# Patient Record
Sex: Male | Born: 1945 | Race: White | Hispanic: No | Marital: Single | Smoking: Never smoker
Health system: Southern US, Community
[De-identification: ages and names within clinical notes are randomized; demographics above are authoritative.]

## PROBLEM LIST (undated history)

## (undated) DIAGNOSIS — E785 Hyperlipidemia, unspecified: Secondary | ICD-10-CM

## (undated) DIAGNOSIS — Z8619 Personal history of other infectious and parasitic diseases: Secondary | ICD-10-CM

## (undated) DIAGNOSIS — K5790 Diverticulosis of intestine, part unspecified, without perforation or abscess without bleeding: Secondary | ICD-10-CM

## (undated) DIAGNOSIS — K429 Umbilical hernia without obstruction or gangrene: Secondary | ICD-10-CM

## (undated) DIAGNOSIS — N200 Calculus of kidney: Secondary | ICD-10-CM

## (undated) DIAGNOSIS — I10 Essential (primary) hypertension: Secondary | ICD-10-CM

## (undated) DIAGNOSIS — G8929 Other chronic pain: Secondary | ICD-10-CM

## (undated) DIAGNOSIS — M545 Low back pain, unspecified: Secondary | ICD-10-CM

## (undated) DIAGNOSIS — N481 Balanitis: Secondary | ICD-10-CM

## (undated) DIAGNOSIS — M2550 Pain in unspecified joint: Secondary | ICD-10-CM

## (undated) DIAGNOSIS — G5600 Carpal tunnel syndrome, unspecified upper limb: Secondary | ICD-10-CM

## (undated) DIAGNOSIS — R519 Headache, unspecified: Secondary | ICD-10-CM

## (undated) DIAGNOSIS — B360 Pityriasis versicolor: Secondary | ICD-10-CM

## (undated) DIAGNOSIS — K219 Gastro-esophageal reflux disease without esophagitis: Secondary | ICD-10-CM

## (undated) DIAGNOSIS — E7800 Pure hypercholesterolemia, unspecified: Secondary | ICD-10-CM

## (undated) DIAGNOSIS — M6208 Separation of muscle (nontraumatic), other site: Secondary | ICD-10-CM

## (undated) DIAGNOSIS — R51 Headache: Secondary | ICD-10-CM

## (undated) DIAGNOSIS — N400 Enlarged prostate without lower urinary tract symptoms: Secondary | ICD-10-CM

## (undated) HISTORY — DX: Low back pain: M54.5

## (undated) HISTORY — DX: Headache, unspecified: R51.9

## (undated) HISTORY — DX: Benign prostatic hyperplasia without lower urinary tract symptoms: N40.0

## (undated) HISTORY — DX: Separation of muscle (nontraumatic), other site: M62.08

## (undated) HISTORY — DX: Carpal tunnel syndrome, unspecified upper limb: G56.00

## (undated) HISTORY — DX: Umbilical hernia without obstruction or gangrene: K42.9

## (undated) HISTORY — DX: Low back pain, unspecified: M54.50

## (undated) HISTORY — DX: Headache: R51

## (undated) HISTORY — DX: Diverticulosis of intestine, part unspecified, without perforation or abscess without bleeding: K57.90

## (undated) HISTORY — DX: Hyperlipidemia, unspecified: E78.5

## (undated) HISTORY — DX: Pain in unspecified joint: M25.50

## (undated) HISTORY — DX: Other chronic pain: G89.29

## (undated) HISTORY — DX: Pityriasis versicolor: B36.0

## (undated) HISTORY — DX: Balanitis: N48.1

## (undated) HISTORY — DX: Personal history of other infectious and parasitic diseases: Z86.19

## (undated) HISTORY — PX: TONSILLECTOMY: SUR1361

---

## 2001-02-13 ENCOUNTER — Ambulatory Visit (HOSPITAL_COMMUNITY): Admission: RE | Admit: 2001-02-13 | Discharge: 2001-02-13 | Payer: Self-pay | Admitting: *Deleted

## 2001-02-13 ENCOUNTER — Encounter (INDEPENDENT_AMBULATORY_CARE_PROVIDER_SITE_OTHER): Payer: Self-pay

## 2001-03-01 ENCOUNTER — Emergency Department (HOSPITAL_COMMUNITY): Admission: EM | Admit: 2001-03-01 | Discharge: 2001-03-01 | Payer: Self-pay | Admitting: Emergency Medicine

## 2003-02-04 ENCOUNTER — Encounter: Admission: RE | Admit: 2003-02-04 | Discharge: 2003-02-04 | Payer: Self-pay | Admitting: Internal Medicine

## 2005-09-06 ENCOUNTER — Ambulatory Visit (HOSPITAL_COMMUNITY): Admission: RE | Admit: 2005-09-06 | Discharge: 2005-09-06 | Payer: Self-pay | Admitting: *Deleted

## 2005-09-06 ENCOUNTER — Encounter (INDEPENDENT_AMBULATORY_CARE_PROVIDER_SITE_OTHER): Payer: Self-pay | Admitting: Specialist

## 2005-09-08 ENCOUNTER — Ambulatory Visit (HOSPITAL_COMMUNITY): Admission: RE | Admit: 2005-09-08 | Discharge: 2005-09-08 | Payer: Self-pay | Admitting: *Deleted

## 2008-06-11 ENCOUNTER — Encounter: Admission: RE | Admit: 2008-06-11 | Discharge: 2008-07-16 | Payer: Self-pay | Admitting: Rheumatology

## 2010-02-12 ENCOUNTER — Encounter: Admission: RE | Admit: 2010-02-12 | Discharge: 2010-02-12 | Payer: Self-pay | Admitting: Gastroenterology

## 2010-08-21 NOTE — Procedures (Signed)
[facility]  Patient:    DEQUINCY, BORN Visit Number: [id number] MRN: [id number]          Service Type: END Location: ENDO Attending Physician:  Sabino Gasser Dictated by:   Sabino Gasser, M.D. Admit Date:  02/13/2001                             Procedure Report  PROCEDURE:  Upper endoscopy.  SURGEON:  Sabino Gasser, M.D.  INDICATIONS:  GERD.  ANESTHESIA:  Demerol 50 and Versed 6 mg.  DESCRIPTION OF PROCEDURE:  With the patient mildly sedated in the left lateral decubitus position, the Olympus videoscopic endoscope was inserted in the mouth and passed under direct vision through the esophagus.  The distal esophagus was approached and showed areas of esophagitis, questionable small area of Barretts that was biopsied and photographed.  We entered into the stomach.  The fundus, body, antrum, duodenal bulb, and second portion of the duodenum was visualized and photographs taken.  From this point, the endoscope was slowly withdrawn, taking circumferential views of the entire duodenal mucosa until the endoscope was pulled back in the stomach and placed on retroflexion to view the stomach from below and this was photographed.  The endoscope was straightened and withdrawn taking circumferential views of the entire gastric and esophageal mucosa, stopping in the stomach first to biopsy. There is diffuse erythema seen throughout the stomach and in the distal esophagus to biopsy the previously photographed changes of esophagitis.  The patients vital signs and pulse oximeter remained stable.  The patient tolerated the procedure well and without apparent complications.  FINDINGS:  Mild duodenitis, photographed.  Extensive gastritis with erythema diffusely, photographed and biopsied, and distal esophagitis, biopsied and photographed.  PLAN:  Await biopsy report.  The patient will call me for results and follow up with me as an outpatient.  Proceed to colonoscopy  as planned. Dictated by:   Sabino Gasser, M.D. Attending Physician:  Sabino Gasser DD:  02/13/01 TD:  02/14/01 Job: [id number]

## 2010-08-21 NOTE — Op Note (Signed)
[name], [name]              ACCOUNT NO.:  [id number]   MEDICAL RECORD NO.:  [id number]          PATIENT TYPE:  AMB   LOCATION:  ENDO                         FACILITY:  [facility]   PHYSICIAN:  Georgiana Spinner, M.D.    DATE OF BIRTH:  [birth year]   DATE OF PROCEDURE:  09/06/2005  DATE OF DISCHARGE:                                 OPERATIVE REPORT   PROCEDURE:  Upper endoscopy.   INDICATIONS:  Abdominal pain.   ANESTHESIA:  Demerol 60, Versed 5 mg.   PROCEDURE:  With the patient mildly sedated, in the left lateral decubitus  position, the Olympus videoscopic endoscope was inserted in the mouth and  passed under direct vision through the esophagus which appeared normal.  Distal esophagus was approached and the squamocolumnar junction would never  open up fully to allow me to tell if there were any areas of Barrett's  esophagus, so elected to just biopsy around the perimeter of this to look  for possible Barrett's.  We entered into the stomach.  Fundus, body, antrum,  duodenal bulb and second portion of duodenum appeared normal.  From this  point, the endoscope was slowly withdrawn, taking circumferential views of  duodenal mucosa until the endoscope had been pulled back into the stomach,  placed in retroflexion to view the stomach from below.  The endoscope was  straightened and withdrawn, taking circumferential views of remaining  gastric and esophageal mucosa.  The patient's vital signs and pulse oximeter  remained stable.  The patient tolerated procedure well without apparent  complications.   FINDINGS:  Unremarkable examination.   PLAN:  Await biopsy report and schedule the patient for CT scan of abdomen  and pelvis, and have patient follow-up with me as an outpatient.           ______________________________  Georgiana Spinner, M.D.     GMO/MEDQ  D:  09/06/2005  T:  09/06/2005  Job:  [id number]

## 2010-08-21 NOTE — Procedures (Signed)
[facility]  Patient:    Timothy Maxwell, Timothy Maxwell Visit Number: [id number] MRN: [id number]          Service Type: END Location: ENDO Attending Physician:  Sabino Gasser Dictated by:   Sabino Gasser, M.D. Admit Date:  02/13/2001                             Procedure Report  PROCEDURE:  Colonoscopy.  SURGEON:  Sabino Gasser, M.D.  INDICATIONS:  Hemoccult positivity, colon cancer screening.  ANESTHESIA:  Demerol 10 and Versed 2 mg.  DESCRIPTION OF PROCEDURE:  With the patient mildly sedated in the left lateral decubitus position, the Olympus videoscopic colonoscope was inserted in the rectum after a normal rectal exam and passed under direct vision to the cecum, identified by the ileocecal valve and appendiceal orifice, both of which were photographed.  From this point, the colonoscope was slowly withdrawn, taking circumferential views of the entire colonic mucosa, stopping to photograph rare diverticulum seen in the sigmoid colon until we reached the rectum which appeared normal on direct and showed internal hemorrhoids, small on retroflexed view.  The endoscope was straightened and withdrawn.  The patients vital signs and pulse oximeter remained stable.  The patient tolerated the procedure well without apparent complications.  FINDINGS:  An occasional diverticulum in the sigmoid and internal hemorrhoids, otherwise unremarkable colonoscopic examination to the cecum.  PLAN:  Consider repeat examination in 5-10 years. Dictated by:   Sabino Gasser, M.D. Attending Physician:  Sabino Gasser DD:  02/13/01 TD:  02/14/01 Job: [id number]

## 2011-02-24 ENCOUNTER — Other Ambulatory Visit: Payer: Self-pay | Admitting: Rheumatology

## 2011-02-24 DIAGNOSIS — M25559 Pain in unspecified hip: Secondary | ICD-10-CM

## 2011-02-24 DIAGNOSIS — M8700 Idiopathic aseptic necrosis of unspecified bone: Secondary | ICD-10-CM

## 2011-03-01 ENCOUNTER — Ambulatory Visit
Admission: RE | Admit: 2011-03-01 | Discharge: 2011-03-01 | Disposition: A | Discharge status: Home / Self Care | Payer: BC Managed Care – PPO | Source: Ambulatory Visit | Attending: Rheumatology | Admitting: Rheumatology

## 2011-03-01 DIAGNOSIS — M25559 Pain in unspecified hip: Secondary | ICD-10-CM

## 2011-03-01 DIAGNOSIS — M8700 Idiopathic aseptic necrosis of unspecified bone: Secondary | ICD-10-CM

## 2011-03-02 ENCOUNTER — Other Ambulatory Visit: Payer: Self-pay

## 2011-12-22 ENCOUNTER — Other Ambulatory Visit: Payer: Self-pay

## 2015-04-30 ENCOUNTER — Emergency Department (HOSPITAL_COMMUNITY): Payer: PPO

## 2015-04-30 ENCOUNTER — Emergency Department (HOSPITAL_COMMUNITY)
Admission: EM | Admit: 2015-04-30 | Discharge: 2015-04-30 | Disposition: A | Discharge status: Home / Self Care | Payer: PPO | Attending: Emergency Medicine | Admitting: Emergency Medicine

## 2015-04-30 ENCOUNTER — Encounter (HOSPITAL_COMMUNITY): Payer: Self-pay

## 2015-04-30 DIAGNOSIS — Z8719 Personal history of other diseases of the digestive system: Secondary | ICD-10-CM | POA: Diagnosis not present

## 2015-04-30 DIAGNOSIS — Z8639 Personal history of other endocrine, nutritional and metabolic disease: Secondary | ICD-10-CM | POA: Diagnosis not present

## 2015-04-30 DIAGNOSIS — N200 Calculus of kidney: Secondary | ICD-10-CM | POA: Diagnosis not present

## 2015-04-30 DIAGNOSIS — I10 Essential (primary) hypertension: Secondary | ICD-10-CM | POA: Insufficient documentation

## 2015-04-30 DIAGNOSIS — R109 Unspecified abdominal pain: Secondary | ICD-10-CM | POA: Diagnosis not present

## 2015-04-30 DIAGNOSIS — Z880 Allergy status to penicillin: Secondary | ICD-10-CM | POA: Diagnosis not present

## 2015-04-30 DIAGNOSIS — N132 Hydronephrosis with renal and ureteral calculous obstruction: Secondary | ICD-10-CM | POA: Insufficient documentation

## 2015-04-30 HISTORY — DX: Calculus of kidney: N20.0

## 2015-04-30 HISTORY — DX: Pure hypercholesterolemia, unspecified: E78.00

## 2015-04-30 HISTORY — DX: Essential (primary) hypertension: I10

## 2015-04-30 HISTORY — DX: Gastro-esophageal reflux disease without esophagitis: K21.9

## 2015-04-30 LAB — URINALYSIS, ROUTINE W REFLEX MICROSCOPIC
Bilirubin Urine: NEGATIVE
Glucose, UA: NEGATIVE mg/dL
Ketones, ur: 40 mg/dL — AB
Leukocytes, UA: NEGATIVE
Nitrite: NEGATIVE
Protein, ur: NEGATIVE mg/dL
Specific Gravity, Urine: 1.017 (ref 1.005–1.030)
pH: 5.5 (ref 5.0–8.0)

## 2015-04-30 LAB — CBC WITH DIFFERENTIAL/PLATELET
Basophils Absolute: 0 10*3/uL (ref 0.0–0.1)
Basophils Relative: 0 %
Eosinophils Absolute: 0 10*3/uL (ref 0.0–0.7)
Eosinophils Relative: 0 %
HCT: 46.6 % (ref 39.0–52.0)
Hemoglobin: 15.4 g/dL (ref 13.0–17.0)
Lymphocytes Relative: 8 %
Lymphs Abs: 1.1 10*3/uL (ref 0.7–4.0)
MCH: 31.8 pg (ref 26.0–34.0)
MCHC: 33 g/dL (ref 30.0–36.0)
MCV: 96.1 fL (ref 78.0–100.0)
Monocytes Absolute: 1.2 10*3/uL — ABNORMAL HIGH (ref 0.1–1.0)
Monocytes Relative: 9 %
Neutro Abs: 11.3 10*3/uL — ABNORMAL HIGH (ref 1.7–7.7)
Neutrophils Relative %: 83 %
Platelets: 253 10*3/uL (ref 150–400)
RBC: 4.85 MIL/uL (ref 4.22–5.81)
RDW: 12.9 % (ref 11.5–15.5)
WBC: 13.7 10*3/uL — ABNORMAL HIGH (ref 4.0–10.5)

## 2015-04-30 LAB — BASIC METABOLIC PANEL
Anion gap: 16 — ABNORMAL HIGH (ref 5–15)
BUN: 22 mg/dL — ABNORMAL HIGH (ref 6–20)
CO2: 20 mmol/L — ABNORMAL LOW (ref 22–32)
Calcium: 9.6 mg/dL (ref 8.9–10.3)
Chloride: 104 mmol/L (ref 101–111)
Creatinine, Ser: 1.27 mg/dL — ABNORMAL HIGH (ref 0.61–1.24)
GFR calc Af Amer: >60 mL/min (ref >60)
GFR calc non Af Amer: 57 mL/min — ABNORMAL LOW (ref >60)
Glucose, Bld: 140 mg/dL — ABNORMAL HIGH (ref 65–99)
Potassium: 3.8 mmol/L (ref 3.5–5.1)
Sodium: 140 mmol/L (ref 135–145)

## 2015-04-30 LAB — URINE MICROSCOPIC-ADD ON

## 2015-04-30 MED ORDER — OXYCODONE-ACETAMINOPHEN 5-325 MG PO TABS: 1.0000 | ORAL_TABLET | Freq: Four times a day (QID) | ORAL | Status: DC | PRN

## 2015-04-30 MED ORDER — MORPHINE SULFATE (PF) 4 MG/ML IV SOLN
4.0000 mg | Freq: Once | INTRAVENOUS | Status: AC
  Administered 2015-04-30: 4 mg via INTRAVENOUS
  Filled 2015-04-30: qty 1

## 2015-04-30 MED ORDER — TAMSULOSIN HCL 0.4 MG PO CAPS: 0.4000 mg | ORAL_CAPSULE | Freq: Every day | ORAL | Status: DC

## 2015-04-30 MED ORDER — KETOROLAC TROMETHAMINE 30 MG/ML IJ SOLN
30.0000 mg | Freq: Once | INTRAMUSCULAR | Status: AC
  Administered 2015-04-30: 30 mg via INTRAVENOUS
  Filled 2015-04-30: qty 1

## 2015-04-30 MED ORDER — ONDANSETRON HCL 4 MG/2ML IJ SOLN
4.0000 mg | Freq: Once | INTRAMUSCULAR | Status: AC
  Administered 2015-04-30: 4 mg via INTRAVENOUS
  Filled 2015-04-30: qty 2

## 2015-04-30 NOTE — ED Provider Notes (Signed)
CSN: [id number]     Arrival date & time 04/30/15  0413 History   First MD Initiated Contact with Patient 04/30/15 0425     Chief Complaint  Patient presents with  . Flank Pain     (Consider location/radiation/quality/duration/timing/severity/associated sxs/prior Treatment) HPI Comments: Patient is a 70 year old male with history of hypertension and acid reflux. He presents for evaluation of left flank pain that started 2 days ago. It has been occurring intermittently and suddenly became much more severe early this morning while he was sleeping. He denies any fevers or chills. He denies any blood in the stool or urine. He denies any difficulty urinating or having bowel movements.  Patient is a 70 y.o. male presenting with flank pain. The history is provided by the patient.  Flank Pain This is a new problem. The current episode started 2 days ago. The problem has been rapidly worsening. Pertinent negatives include no abdominal pain. Nothing aggravates the symptoms. Nothing relieves the symptoms. He has tried nothing for the symptoms. The treatment provided no relief.    Past Medical History  Diagnosis Date  . Hypertension   . GERD (gastroesophageal reflux disease)   . High cholesterol   . Renal calculi    No past surgical history on file. No family history on file. Social History  Substance Use Topics  . Smoking status: Not on file  . Smokeless tobacco: Not on file  . Alcohol Use: Not on file    Review of Systems  Gastrointestinal: Negative for abdominal pain.  Genitourinary: Positive for flank pain.  All other systems reviewed and are negative.     Allergies  Penicillins  Home Medications   Prior to Admission medications   Not on File   BP 144/82 mmHg  Pulse 86  Temp(Src) 98 F (36.7 C) (Oral)  Resp 16  SpO2 99% Physical Exam  Constitutional: He is oriented to person, place, and time. He appears well-developed and well-nourished. No distress.  HENT:  Head:  Normocephalic and atraumatic.  Mouth/Throat: Oropharynx is clear and moist.  Neck: Normal range of motion. Neck supple.  Cardiovascular: Normal rate, regular rhythm and normal heart sounds.   No murmur heard. Pulmonary/Chest: Effort normal and breath sounds normal. No respiratory distress. He has no wheezes. He has no rales.  Abdominal: Soft. Bowel sounds are normal. He exhibits no distension. There is tenderness. There is no rebound and no guarding.  There is tenderness to palpation in the left flank and left lower quadrant.  Musculoskeletal: Normal range of motion. He exhibits no edema.  Neurological: He is alert and oriented to person, place, and time.  Skin: Skin is warm and dry. He is not diaphoretic.  Nursing note and vitals reviewed.   ED Course  Procedures (including critical care time) Labs Review Labs Reviewed  URINALYSIS, ROUTINE W REFLEX MICROSCOPIC (NOT AT [facility])  BASIC METABOLIC PANEL  CBC WITH DIFFERENTIAL/PLATELET    Imaging Review No results found. I have personally reviewed and evaluated these images and lab results as part of my medical decision-making.   MDM   Final diagnoses:  None    CT reveals a 4 mm stone in the proximal left ureter with hydronephrosis. His urine is not suggestive of infection. He does have a slight white count, however no fever. He is nontoxic-appearing and feeling better after pain medication in the ER. He will be discharged with pain meds, Flomax, and follow-up with urology if not improving in the next several days.  Geoffery Lyons, MD 04/30/15 [phone]

## 2015-04-30 NOTE — ED Notes (Signed)
Bed: ZO10 Expected date:  Expected time:  Means of arrival:  Comments: EMS back and flank pain

## 2015-04-30 NOTE — ED Notes (Signed)
Awake. Verbally responsive. A/O x4. Resp even and unlabored. No audible adventitious breath sounds noted. ABC's intact.  

## 2015-04-30 NOTE — Discharge Instructions (Signed)
Percocet as prescribed as needed for pain.  Flomax as prescribed.  Follow-up with [facility] if you're not improving in the next 24-48 hours. Their contact information has been provided in this discharge summary for you to call and arrange this appointment.   Kidney Stones Kidney stones (urolithiasis) are deposits that form inside your kidneys. The intense pain is caused by the stone moving through the urinary tract. When the stone moves, the ureter goes into spasm around the stone. The stone is usually passed in the urine.  CAUSES   A disorder that makes certain neck glands produce too much parathyroid hormone (primary hyperparathyroidism).  A buildup of uric acid crystals, similar to gout in your joints.  Narrowing (stricture) of the ureter.  A kidney obstruction present at birth (congenital obstruction).  Previous surgery on the kidney or ureters.  Numerous kidney infections. SYMPTOMS   Feeling sick to your stomach (nauseous).  Throwing up (vomiting).  Blood in the urine (hematuria).  Pain that usually spreads (radiates) to the groin.  Frequency or urgency of urination. DIAGNOSIS   Taking a history and physical exam.  Blood or urine tests.  CT scan.  Occasionally, an examination of the inside of the urinary bladder (cystoscopy) is performed. TREATMENT   Observation.  Increasing your fluid intake.  Extracorporeal shock wave lithotripsy--This is a noninvasive procedure that uses shock waves to break up kidney stones.  Surgery may be needed if you have severe pain or persistent obstruction. There are various surgical procedures. Most of the procedures are performed with the use of small instruments. Only small incisions are needed to accommodate these instruments, so recovery time is minimized. The size, location, and chemical composition are all important variables that will determine the proper choice of action for you. Talk to your health care provider to  better understand your situation so that you will minimize the risk of injury to yourself and your kidney.  HOME CARE INSTRUCTIONS   Drink enough water and fluids to keep your urine clear or pale yellow. This will help you to pass the stone or stone fragments.  Strain all urine through the provided strainer. Keep all particulate matter and stones for your health care provider to see. The stone causing the pain may be as small as a grain of salt. It is very important to use the strainer each and every time you pass your urine. The collection of your stone will allow your health care provider to analyze it and verify that a stone has actually passed. The stone analysis will often identify what you can do to reduce the incidence of recurrences.  Only take over-the-counter or prescription medicines for pain, discomfort, or fever as directed by your health care provider.  Keep all follow-up visits as told by your health care provider. This is important.  Get follow-up X-rays if required. The absence of pain does not always mean that the stone has passed. It may have only stopped moving. If the urine remains completely obstructed, it can cause loss of kidney function or even complete destruction of the kidney. It is your responsibility to make sure X-rays and follow-ups are completed. Ultrasounds of the kidney can show blockages and the status of the kidney. Ultrasounds are not associated with any radiation and can be performed easily in a matter of minutes.  Make changes to your daily diet as told by your health care provider. You may be told to:  Limit the amount of salt that you eat.  Eat  5 or more servings of fruits and vegetables each day.  Limit the amount of meat, poultry, fish, and eggs that you eat.  Collect a 24-hour urine sample as told by your health care provider.You may need to collect another urine sample every 6-12 months. SEEK MEDICAL CARE IF:  You experience pain that is  progressive and unresponsive to any pain medicine you have been prescribed. SEEK IMMEDIATE MEDICAL CARE IF:   Pain cannot be controlled with the prescribed medicine.  You have a fever or shaking chills.  The severity or intensity of pain increases over 18 hours and is not relieved by pain medicine.  You develop a new onset of abdominal pain.  You feel faint or pass out.  You are unable to urinate.   This information is not intended to replace advice given to you by your health care provider. Make sure you discuss any questions you have with your health care provider.   Document Released: 03/22/2005 Document Revised: 12/11/2014 Document Reviewed: 08/23/2012 Elsevier Interactive Patient Education [employer]

## 2015-05-02 ENCOUNTER — Emergency Department (HOSPITAL_COMMUNITY)
Admission: EM | Admit: 2015-05-02 | Discharge: 2015-05-02 | Disposition: A | Discharge status: Home / Self Care | Payer: PPO | Attending: Emergency Medicine | Admitting: Emergency Medicine

## 2015-05-02 ENCOUNTER — Encounter (HOSPITAL_COMMUNITY): Payer: Self-pay | Admitting: Emergency Medicine

## 2015-05-02 ENCOUNTER — Emergency Department (HOSPITAL_COMMUNITY): Payer: PPO

## 2015-05-02 DIAGNOSIS — R7989 Other specified abnormal findings of blood chemistry: Secondary | ICD-10-CM | POA: Insufficient documentation

## 2015-05-02 DIAGNOSIS — I10 Essential (primary) hypertension: Secondary | ICD-10-CM | POA: Insufficient documentation

## 2015-05-02 DIAGNOSIS — K219 Gastro-esophageal reflux disease without esophagitis: Secondary | ICD-10-CM | POA: Diagnosis not present

## 2015-05-02 DIAGNOSIS — Z7982 Long term (current) use of aspirin: Secondary | ICD-10-CM | POA: Insufficient documentation

## 2015-05-02 DIAGNOSIS — N200 Calculus of kidney: Secondary | ICD-10-CM | POA: Insufficient documentation

## 2015-05-02 DIAGNOSIS — Z79899 Other long term (current) drug therapy: Secondary | ICD-10-CM | POA: Diagnosis not present

## 2015-05-02 DIAGNOSIS — K5900 Constipation, unspecified: Secondary | ICD-10-CM | POA: Insufficient documentation

## 2015-05-02 DIAGNOSIS — N179 Acute kidney failure, unspecified: Secondary | ICD-10-CM | POA: Insufficient documentation

## 2015-05-02 DIAGNOSIS — Z880 Allergy status to penicillin: Secondary | ICD-10-CM | POA: Diagnosis not present

## 2015-05-02 DIAGNOSIS — R079 Chest pain, unspecified: Secondary | ICD-10-CM | POA: Diagnosis not present

## 2015-05-02 DIAGNOSIS — E7800 Pure hypercholesterolemia, unspecified: Secondary | ICD-10-CM | POA: Insufficient documentation

## 2015-05-02 DIAGNOSIS — R0789 Other chest pain: Secondary | ICD-10-CM | POA: Diagnosis not present

## 2015-05-02 DIAGNOSIS — N201 Calculus of ureter: Secondary | ICD-10-CM | POA: Diagnosis not present

## 2015-05-02 LAB — CBC
HCT: 42.2 % (ref 39.0–52.0)
Hemoglobin: 14.4 g/dL (ref 13.0–17.0)
MCH: 32.1 pg (ref 26.0–34.0)
MCHC: 34.1 g/dL (ref 30.0–36.0)
MCV: 94 fL (ref 78.0–100.0)
PLATELETS: 204 10*3/uL (ref 150–400)
RBC: 4.49 MIL/uL (ref 4.22–5.81)
RDW: 12.7 % (ref 11.5–15.5)
WBC: 12.8 10*3/uL — AB (ref 4.0–10.5)

## 2015-05-02 LAB — URINALYSIS, ROUTINE W REFLEX MICROSCOPIC
BILIRUBIN URINE: NEGATIVE
GLUCOSE, UA: NEGATIVE mg/dL
Ketones, ur: NEGATIVE mg/dL
Leukocytes, UA: NEGATIVE
Nitrite: NEGATIVE
Protein, ur: NEGATIVE mg/dL
SPECIFIC GRAVITY, URINE: 1.006 (ref 1.005–1.030)
pH: 5 (ref 5.0–8.0)

## 2015-05-02 LAB — I-STAT TROPONIN, ED
TROPONIN I, POC: 0 ng/mL (ref 0.00–0.08)
Troponin i, poc: 0 ng/mL (ref 0.00–0.08)

## 2015-05-02 LAB — BASIC METABOLIC PANEL
ANION GAP: 10 (ref 5–15)
BUN: 17 mg/dL (ref 6–20)
CO2: 24 mmol/L (ref 22–32)
CREATININE: 1.53 mg/dL — AB (ref 0.61–1.24)
Calcium: 8.9 mg/dL (ref 8.9–10.3)
Chloride: 102 mmol/L (ref 101–111)
GFR, EST AFRICAN AMERICAN: 53 mL/min — AB (ref >60)
GFR, EST NON AFRICAN AMERICAN: 45 mL/min — AB (ref >60)
Glucose, Bld: 117 mg/dL — ABNORMAL HIGH (ref 65–99)
POTASSIUM: 4.4 mmol/L (ref 3.5–5.1)
SODIUM: 136 mmol/L (ref 135–145)

## 2015-05-02 LAB — URINE MICROSCOPIC-ADD ON
Squamous Epithelial / LPF: NONE SEEN
WBC UA: NONE SEEN WBC/hpf (ref 0–5)

## 2015-05-02 MED ORDER — SODIUM CHLORIDE 0.9 % IV BOLUS (SEPSIS)
1000.0000 mL | Freq: Once | INTRAVENOUS | Status: AC
  Administered 2015-05-02: 1000 mL via INTRAVENOUS

## 2015-05-02 MED ORDER — KETOROLAC TROMETHAMINE 30 MG/ML IJ SOLN
30.0000 mg | Freq: Once | INTRAMUSCULAR | Status: AC
  Administered 2015-05-02: 30 mg via INTRAVENOUS
  Filled 2015-05-02: qty 1

## 2015-05-02 MED ORDER — ONDANSETRON HCL 4 MG/2ML IJ SOLN
4.0000 mg | Freq: Once | INTRAMUSCULAR | Status: AC
  Administered 2015-05-02: 4 mg via INTRAVENOUS
  Filled 2015-05-02: qty 2

## 2015-05-02 MED ORDER — DOCUSATE SODIUM 100 MG PO CAPS: 100.0000 mg | ORAL_CAPSULE | Freq: Two times a day (BID) | ORAL | Status: DC

## 2015-05-02 NOTE — ED Notes (Signed)
Pt seen here 2 days ago for L flank pain. Was diagnosed with kidney stone. Pt reports his pain is not getting any better, so he returned for re-eval. Denies any worsening or changing symptoms. Pt also began having L side CP upon arrival to ED. EKG taken and shown to [facility] MD. No sob or lightheadedness.

## 2015-05-02 NOTE — Discharge Instructions (Signed)
Take 4 caps of miralax in 1 32oz bottle of gatorate and drink one day, followed by 2 caps a day for 2 days, followed by 1 cap a day.  Do not take NSAIDS.   Acute Kidney Injury Acute kidney injury is any condition in which there is sudden (acute) damage to the kidneys. Acute kidney injury was previously known as acute kidney failure or acute renal failure. The kidneys are two organs that lie on either side of the spine between the middle of the back and the front of the abdomen. The kidneys:  Remove wastes and extra water from the blood.   Produce important hormones. These help keep bones strong, regulate blood pressure, and help create red blood cells.   Balance the fluids and chemicals in the blood and tissues. A small amount of kidney damage may not cause problems, but a large amount of damage may make it difficult or impossible for the kidneys to work the way they should. Acute kidney injury may develop into long-lasting (chronic) kidney disease. It may also develop into a life-threatening disease called end-stage kidney disease. Acute kidney injury can get worse very quickly, so it should be treated right away. Early treatment may prevent other kidney diseases from developing. CAUSES   A problem with blood flow to the kidneys. This may be caused by:   Blood loss.   Heart disease.   Severe burns.   Liver disease.  Direct damage to the kidneys. This may be caused by:  Some medicines.   A kidney infection.   Poisoning or consuming toxic substances.   A surgical wound.   A blow to the kidney area.   A problem with urine flow. This may be caused by:   Cancer.   Kidney stones.   An enlarged prostate. SIGNS AND SYMPTOMS   Swelling (edema) of the legs, ankles, or feet.   Tiredness (lethargy).   Nausea or vomiting.   Confusion.   Problems with urination, such as:   Painful or burning feeling during urination.   Decreased urine production.    Frequent accidents in children who are potty trained.   Bloody urine.   Muscle twitches and cramps.   Shortness of breath.   Seizures.   Chest pain or pressure. Sometimes, no symptoms are present. DIAGNOSIS Acute kidney injury may be detected and diagnosed by tests, including blood, urine, imaging, or kidney biopsy tests.  TREATMENT Treatment of acute kidney injury varies depending on the cause and severity of the kidney damage. In mild cases, no treatment may be needed. The kidneys may heal on their own. If acute kidney injury is more severe, your health care provider will treat the cause of the kidney damage, help the kidneys heal, and prevent complications from occurring. Severe cases may require a procedure to remove toxic wastes from the body (dialysis) or surgery to repair kidney damage. Surgery may involve:   Repair of a torn kidney.   Removal of an obstruction. HOME CARE INSTRUCTIONS  Follow your prescribed diet.  Take medicines only as directed by your health care provider.  Do not take any new medicines (prescription, over-the-counter, or nutritional supplements) unless approved by your health care provider. Many medicines can worsen your kidney damage or may need to have the dose adjusted.   Keep all follow-up visits as directed by your health care provider. This is important.  Observe your condition to make sure you are healing as expected. SEEK IMMEDIATE MEDICAL CARE IF:  You are feeling  ill or have severe pain in the back or side.   Your symptoms return or you have new symptoms.  You have any symptoms of end-stage kidney disease. These include:   Persistent itchiness.   Loss of appetite.   Headaches.   Abnormally dark or light skin.  Numbness in the hands or feet.   Easy bruising.   Frequent hiccups.   Menstruation stops.   You have a fever.  You have increased urine production.  You have pain or bleeding when  urinating. MAKE SURE YOU:   Understand these instructions.  Will watch your condition.  Will get help right away if you are not doing well or get worse.   This information is not intended to replace advice given to you by your health care provider. Make sure you discuss any questions you have with your health care provider.   Document Released: 10/05/2010 Document Revised: 04/12/2014 Document Reviewed: 11/19/2011 Elsevier Interactive Patient Education [employer]

## 2015-05-02 NOTE — ED Provider Notes (Signed)
CSN: [id number]     Arrival date & time 05/02/15  1513 History   First MD Initiated Contact with Patient 05/02/15 1542     Chief Complaint  Patient presents with  . Chest Pain  . Nephrolithiasis     (Consider location/radiation/quality/duration/timing/severity/associated sxs/prior Treatment) HPI   Percocet, ibuprofen, similar pain relief with both Taking percocet every 5 hr, taken 2 total ibuprofen  x 2 Remote hx of kidney stones Left upper abdomen pain, and constipation, no BM, took metamucil x1 Left abd pain severe, constant, waxing/waning No dysuria/fevers  Left sided CP, nonradiates, localized to anterior chest, like a "ping" comes and goes, started on arrival to ED, nonexertional, not worse with deep breaths, no SOB, no nausea with CP (did have some before with kidney stone pain), 2/10, comes and goes  Htn/chl No DM/smoking, dad mom with CAD, dad died at  16, mom had CHF Stress test years ago was normal (greater than 64yr ago)    Past Medical History  Diagnosis Date  . Hypertension   . GERD (gastroesophageal reflux disease)   . High cholesterol   . Renal calculi    History reviewed. No pertinent past surgical history. History reviewed. No pertinent family history. Social History  Substance Use Topics  . Smoking status: None  . Smokeless tobacco: None  . Alcohol Use: None    Review of Systems  Constitutional: Negative for fever.  HENT: Negative for sore throat.   Eyes: Negative for visual disturbance.  Respiratory: Negative for shortness of breath.   Cardiovascular: Positive for chest pain.  Gastrointestinal: Positive for abdominal pain and constipation. Negative for nausea, vomiting and diarrhea.  Genitourinary: Negative for dysuria and difficulty urinating.  Musculoskeletal: Negative for back pain and neck stiffness.  Skin: Negative for rash.  Neurological: Negative for syncope and headaches.      Allergies  Penicillins  Home Medications    Prior to Admission medications   Medication Sig Start Date End Date Taking? Authorizing Provider  aspirin EC 81 MG tablet Take 81 mg by mouth daily.   Yes Historical Provider, MD  atorvastatin (LIPITOR) 40 MG tablet Take 40 mg by mouth daily.   Yes Historical Provider, MD  lisinopril (PRINIVIL,ZESTRIL) 20 MG tablet Take 20 mg by mouth daily.   Yes Historical Provider, MD  Multiple Vitamins-Minerals (MULTIVITAMIN & MINERAL PO) Take 1 tablet by mouth daily.   Yes Historical Provider, MD  oxyCODONE-acetaminophen (PERCOCET) 5-325 MG tablet Take 1-2 tablets by mouth every 6 (six) hours as needed. 04/30/15  Yes Geoffery Lyons, MD  pantoprazole (PROTONIX) 40 MG tablet Take 40 mg by mouth daily.   Yes Historical Provider, MD  tamsulosin (FLOMAX) 0.4 MG CAPS capsule Take 1 capsule (0.4 mg total) by mouth daily. 04/30/15  Yes Geoffery Lyons, MD  docusate sodium (COLACE) 100 MG capsule Take 1 capsule (100 mg total) by mouth every 12 (twelve) hours. 05/02/15   Alvira Monday, MD   BP 134/79 mmHg  Pulse 82  Temp(Src) 98.4 F (36.9 C) (Oral)  Resp 19  SpO2 100% Physical Exam  Constitutional: He is oriented to person, place, and time. He appears well-developed and well-nourished. No distress.  HENT:  Head: Normocephalic and atraumatic.  Eyes: Conjunctivae and EOM are normal.  Neck: Normal range of motion.  Cardiovascular: Normal rate, regular rhythm, normal heart sounds and intact distal pulses.  Exam reveals no gallop and no friction rub.   No murmur heard. Pulmonary/Chest: Effort normal and breath sounds normal. No respiratory distress. He  has no wheezes. He has no rales. He exhibits no tenderness.  Abdominal: Soft. He exhibits no distension. Tenderness: LUQ. There is CVA tenderness (left). There is no guarding.  Musculoskeletal: He exhibits no edema.  Neurological: He is alert and oriented to person, place, and time.  Skin: Skin is warm and dry. He is not diaphoretic.  Nursing note and vitals  reviewed.   ED Course  Procedures (including critical care time) Labs Review Labs Reviewed  BASIC METABOLIC PANEL - Abnormal; Notable for the following:    Glucose, Bld 117 (*)    Creatinine, Ser 1.53 (*)    GFR calc non Af Amer 45 (*)    GFR calc Af Amer 53 (*)    All other components within normal limits  CBC - Abnormal; Notable for the following:    WBC 12.8 (*)    All other components within normal limits  URINALYSIS, ROUTINE W REFLEX MICROSCOPIC (NOT AT [facility] - Abnormal; Notable for the following:    Hgb urine dipstick SMALL (*)    All other components within normal limits  URINE MICROSCOPIC-ADD ON - Abnormal; Notable for the following:    Bacteria, UA RARE (*)    All other components within normal limits  URINE CULTURE  I-STAT TROPOININ, ED  Rosezena Sensor, ED    Imaging Review Dg Chest 2 View  05/02/2015  CLINICAL DATA:  Left-sided chest pain for 1 day, initial encounter EXAM: CHEST  2 VIEW COMPARISON:  02/04/2003 FINDINGS: The heart size and mediastinal contours are within normal limits. Both lungs are clear. The visualized skeletal structures are unremarkable. IMPRESSION: No active cardiopulmonary disease. Electronically Signed   By: Alcide Clever M.D.   On: 05/02/2015 15:51   US Renal  05/02/2015  CLINICAL DATA:  Left ureteral stone EXAM: RENAL / URINARY TRACT ULTRASOUND COMPLETE COMPARISON:  CT 04/30/2015 FINDINGS: Right Kidney: Length: 10.7 cm. Echogenicity within normal limits. No mass or hydronephrosis visualized. Left Kidney: Length: 11.3 cm. Mild hydronephrosis. No mass. Normal echotexture. Bladder: Appears normal for degree of bladder distention. Prostate is enlarged. IMPRESSION: Mild left hydronephrosis as seen on prior CT. Prostate enlargement. Electronically Signed   By: Charlett Nose M.D.   On: 05/02/2015 18:23   I have personally reviewed and evaluated these images and lab results as part of my medical decision-making.   EKG Interpretation   Date/Time:   Friday May 02 2015 15:21:03 EST Ventricular Rate:  81 PR Interval:  142 QRS Duration: 97 QT Interval:  354 QTC Calculation: 411 R Axis:   32 Text Interpretation:  Sinus rhythm Probable left atrial enlargement No  previous ECGs available Confirmed by [facility] MD, Hedwig Mcfall ([id number]) on  05/02/2015 3:52:58 PM      MDM   Final diagnoses:  Chest pain, unspecified chest pain type  Nephrolithiasis  Acute kidney injury (HCC), creatinine 1.5  Constipation, unspecified constipation type   70 year old male with a history of hypertension, hypercholesterolemia, family history of CAD presents with concern for left upper abdominal pain. Patient was diagnosed with a 4 mm obstructing left ureteral stone 2 days ago, and has not had relief with pain medications at home. On arrival to the emergency department he began to have a feeling of pain in the left side of his chest described as an intermittent "ping."  EKG was done and by by me which showed a normal sinus rhythm without any previous EKGs for comparison. Chest x-ray showed no acute antibodies. Troponin is negative. Based on the description of symptoms  have low suspicion for PE, aortic dissection.  Have low suspicion for acute coronary syndrome in setting of 2 negative troponins..  Discussed with patient that given his age and risk factors, he is high risk, however given description of pain in association with or significant left upper abdominal and flank pain, do not feel chest pain rule out admission is indicated at this time and patient agrees.  Discussed that if patient has returning chest pain or shortness of breath he return to the emergency room emergency department, and recommended close outpatient PCP follow-up to discuss further testing.  Patient with mild acute kidney injury on labs with a creatinine up to 1.5 Toradol ordered prior to this result.  Patient given 1 L of normal saline. Discussed the patient should avoid NSAIDs, stay hydrated.  Ordered a renal ultrasound given continuing pain and acute kidney injury, which showed mild hydronephrosis. Discussed with Urology and recommend patient call office on Monday to schedule outpatient appointment. Discussed that patient may take 2 Percocet as needed for his pain. Discussed reasons to return to the emergency department in detail. In addition, pt reports constipation as contributor to pain and discussed stool softeneres, miralax, hydration. Patient discharged in stable condition with understanding of reasons to return.   Alvira Monday, MD 05/03/15 [phone]

## 2015-05-02 NOTE — ED Notes (Signed)
Pt given urinal and aware urine sample is needed. 

## 2015-05-04 LAB — URINE CULTURE

## 2015-05-27 DIAGNOSIS — N201 Calculus of ureter: Secondary | ICD-10-CM | POA: Diagnosis not present

## 2015-05-27 DIAGNOSIS — Z0000 Encounter for general adult medical examination without abnormal findings: Secondary | ICD-10-CM | POA: Diagnosis not present

## 2015-05-27 DIAGNOSIS — N401 Enlarged prostate with lower urinary tract symptoms: Secondary | ICD-10-CM | POA: Diagnosis not present

## 2015-05-27 DIAGNOSIS — N138 Other obstructive and reflux uropathy: Secondary | ICD-10-CM | POA: Diagnosis not present

## 2015-06-10 DIAGNOSIS — N201 Calculus of ureter: Secondary | ICD-10-CM | POA: Diagnosis not present

## 2015-06-10 DIAGNOSIS — Z0000 Encounter for general adult medical examination without abnormal findings: Secondary | ICD-10-CM | POA: Diagnosis not present

## 2015-07-14 DIAGNOSIS — Z125 Encounter for screening for malignant neoplasm of prostate: Secondary | ICD-10-CM | POA: Diagnosis not present

## 2015-07-14 DIAGNOSIS — I10 Essential (primary) hypertension: Secondary | ICD-10-CM | POA: Diagnosis not present

## 2015-07-14 DIAGNOSIS — K219 Gastro-esophageal reflux disease without esophagitis: Secondary | ICD-10-CM | POA: Diagnosis not present

## 2015-07-14 DIAGNOSIS — E7800 Pure hypercholesterolemia, unspecified: Secondary | ICD-10-CM | POA: Diagnosis not present

## 2015-07-14 DIAGNOSIS — Z0000 Encounter for general adult medical examination without abnormal findings: Secondary | ICD-10-CM | POA: Diagnosis not present

## 2015-07-17 DIAGNOSIS — E782 Mixed hyperlipidemia: Secondary | ICD-10-CM | POA: Diagnosis not present

## 2015-07-17 DIAGNOSIS — K429 Umbilical hernia without obstruction or gangrene: Secondary | ICD-10-CM | POA: Diagnosis not present

## 2015-07-17 DIAGNOSIS — R072 Precordial pain: Secondary | ICD-10-CM | POA: Diagnosis not present

## 2015-07-17 DIAGNOSIS — K219 Gastro-esophageal reflux disease without esophagitis: Secondary | ICD-10-CM | POA: Diagnosis not present

## 2015-08-05 ENCOUNTER — Encounter: Payer: Self-pay | Admitting: *Deleted

## 2015-08-08 ENCOUNTER — Encounter: Payer: Self-pay | Admitting: Cardiovascular Disease

## 2015-08-08 ENCOUNTER — Ambulatory Visit (INDEPENDENT_AMBULATORY_CARE_PROVIDER_SITE_OTHER): Payer: PPO | Admitting: Cardiovascular Disease

## 2015-08-08 VITALS — BP 120/72 | HR 77 | Ht 65.0 in | Wt 144.8 lb

## 2015-08-08 DIAGNOSIS — Z7189 Other specified counseling: Secondary | ICD-10-CM | POA: Diagnosis not present

## 2015-08-08 DIAGNOSIS — Z7689 Persons encountering health services in other specified circumstances: Secondary | ICD-10-CM

## 2015-08-08 DIAGNOSIS — R0789 Other chest pain: Secondary | ICD-10-CM | POA: Diagnosis not present

## 2015-08-08 NOTE — Patient Instructions (Signed)
Medication Instructions:  Your physician recommends that you continue on your current medications as directed. Please refer to the Current Medication list given to you today.  Labwork: NONE  Testing/Procedures: Your physician has requested that you have an exercise tolerance test. For further information please visit [email or web address]. Please also follow instruction sheet, as given.  Follow-Up: Your physician wants you to follow-up as needed.   If you need a refill on your cardiac medications before your next appointment, please call your pharmacy.

## 2015-08-08 NOTE — Progress Notes (Addendum)
Patient ID: Timothy Maxwell, male   DOB: [birth year], 70 y.o.   MRN: [id number]     Cardiology Office Note   Date:  08/08/2015   ID:  Timothy Maxwell, DOB 1945/10/01, MRN [id number]  PCP:  Londell Moh, MD  Cardiologist:   Charlton Haws, MD   No chief complaint on file.     History of Present Illness: Timothy Maxwell is a 70 y.o. male who presents for evaluation of chest pain CRF;s elevated lipids, family history and HTN.  Dad died of MI at age 97  Seen in ER January with abdominal pain and kidney stone. Also complained of SSCP sharp left side of chest like a "ping".  R/O normal ECG Has had normal stress tests in past more than 5 years ago. Saw Dr Phoebe Perch for urology Passed stone On flomax for prostate.  Retired from [employer]. Walks daily without issues. Occasional palpitations at night. No recurrent chest pain     Past Medical History  Diagnosis Date  . Hypertension   . GERD (gastroesophageal reflux disease)   . High cholesterol   . Renal calculi   . HLD (hyperlipidemia)   . Chronic headaches   . CTS (carpal tunnel syndrome)   . History of Helicobacter infection   . BPH (benign prostatic hyperplasia)   . Lower back pain   . Tinea versicolor   . Umbilical hernia   . Diastasis recti   . Arthralgia of multiple joints   . Balanitis   . Diverticulosis     Past Surgical History  Procedure Laterality Date  . Tonsillectomy       Current Outpatient Prescriptions  Medication Sig Dispense Refill  . aspirin EC 81 MG tablet Take 81 mg by mouth daily.    Marland Kitchen atorvastatin (LIPITOR) 40 MG tablet Take 40 mg by mouth daily.    Marland Kitchen docusate sodium (COLACE) 100 MG capsule Take 1 capsule (100 mg total) by mouth every 12 (twelve) hours. 60 capsule 0  . lisinopril (PRINIVIL,ZESTRIL) 20 MG tablet Take 20 mg by mouth daily.    . Multiple Vitamins-Minerals (MULTIVITAMIN & MINERAL PO) Take 1 tablet by mouth daily.    Marland Kitchen oxyCODONE-acetaminophen (PERCOCET) 5-325 MG tablet Take 1-2 tablets by  mouth every 6 (six) hours as needed. 25 tablet 0  . pantoprazole (PROTONIX) 40 MG tablet Take 40 mg by mouth daily.    . tamsulosin (FLOMAX) 0.4 MG CAPS capsule Take 1 capsule (0.4 mg total) by mouth daily. 10 capsule 0   No current facility-administered medications for this visit.    Allergies:   Penicillins    Social History:  The patient  reports that he has never smoked. He has never used smokeless tobacco. He reports that he does not drink alcohol or use illicit drugs.   Family History:  The patient's family history includes Emphysema in his mother; Heart disease in his father.    ROS:  Please see the history of present illness.   Otherwise, review of systems are positive for none.   All other systems are reviewed and negative.    PHYSICAL EXAM: VS:  There were no vitals taken for this visit. , BMI There is no height or weight on file to calculate BMI. Affect appropriate Healthy:  appears stated age HEENT: normal Neck supple with no adenopathy JVP normal no bruits no thyromegaly Lungs clear with no wheezing and good diaphragmatic motion Heart:  S1/S2 no murmur, no rub, gallop or click PMI normal Abdomen: benighn, BS  positve, no tenderness, no AAA no bruit.  No HSM or HJR Distal pulses intact with no bruits No edema Neuro non-focal Skin warm and dry No muscular weakness    EKG:  05/03/15  SR rate 91  Normal  08/07/15  SR rate 75 normal    Recent Labs: 05/02/2015: BUN 17; Creatinine, Ser 1.53*; Hemoglobin 14.4; Platelets 204; Potassium 4.4; Sodium 136    Lipid Panel No results found for: CHOL, TRIG, HDL, CHOLHDL, VLDL, LDLCALC, LDLDIRECT    Wt Readings from Last 3 Encounters:  No data found for Wt      Other studies Reviewed: Additional studies/ records that were reviewed today include: ER records 05/03/15 ECG labs and primary care/ urology notes see HPI.    ASSESSMENT AND PLAN:  1.  Chest Pain : Atypical normal ECG f/u ETT 2. HTN:  Well controlled.   Continue current medications and low sodium Dash type diet.   3. Chol:   On statin labs with primary 4. Urology:  Passed stone f/u Otelin no residual infection or hematuria    Current medicines are reviewed at length with the patient today.  The patient does not have concerns regarding medicines.  The following changes have been made:  no change  Labs/ tests ordered today include: ETT  No orders of the defined types were placed in this encounter.     Disposition:   FU with me PRN     Signed, Charlton HawsPeter Nishan, MD  08/08/2015 8:57 AM    [facility] [address], [place], [place]  [address] Phone: [phone]; Fax: [phone]

## 2015-08-12 ENCOUNTER — Ambulatory Visit (INDEPENDENT_AMBULATORY_CARE_PROVIDER_SITE_OTHER): Payer: PPO

## 2015-08-12 DIAGNOSIS — R0789 Other chest pain: Secondary | ICD-10-CM | POA: Diagnosis not present

## 2015-08-12 DIAGNOSIS — Z7189 Other specified counseling: Secondary | ICD-10-CM

## 2015-08-12 DIAGNOSIS — Z7689 Persons encountering health services in other specified circumstances: Secondary | ICD-10-CM

## 2015-08-12 LAB — EXERCISE TOLERANCE TEST
CHL CUP MPHR: 153 {beats}/min
CHL CUP STRESS STAGE 1 DBP: 83 mmHg
CHL CUP STRESS STAGE 1 GRADE: 0 %
CHL CUP STRESS STAGE 1 SPEED: 0 mph
CHL CUP STRESS STAGE 2 SPEED: 1 mph
CHL CUP STRESS STAGE 3 GRADE: 0 %
CHL CUP STRESS STAGE 3 HR: 83 {beats}/min
CHL CUP STRESS STAGE 3 SPEED: 1 mph
CHL CUP STRESS STAGE 4 DBP: 80 mmHg
CHL CUP STRESS STAGE 4 SBP: 160 mmHg
CHL CUP STRESS STAGE 5 HR: 125 {beats}/min
CHL CUP STRESS STAGE 6 DBP: 81 mmHg
CHL CUP STRESS STAGE 6 SPEED: 3.4 mph
CHL CUP STRESS STAGE 7 GRADE: 14 %
CHL CUP STRESS STAGE 8 DBP: 82 mmHg
CHL CUP STRESS STAGE 8 GRADE: 0 %
CHL CUP STRESS STAGE 8 HR: 141 {beats}/min
CHL CUP STRESS STAGE 8 SBP: 205 mmHg
CHL CUP STRESS STAGE 9 DBP: 72 mmHg
CHL CUP STRESS STAGE 9 GRADE: 0 %
CHL RATE OF PERCEIVED EXERTION: 16
CSEPED: 9 min
CSEPEDS: 0 s
CSEPEW: 10.1 METS
CSEPPMHR: 101 %
Peak HR: 155 {beats}/min
Percent HR: 102 %
Rest HR: 74 {beats}/min
Stage 1 HR: 81 {beats}/min
Stage 1 SBP: 143 mmHg
Stage 2 Grade: 0 %
Stage 2 HR: 84 {beats}/min
Stage 4 Grade: 10 %
Stage 4 HR: 106 {beats}/min
Stage 4 Speed: 1.7 mph
Stage 5 DBP: 80 mmHg
Stage 5 Grade: 12 %
Stage 5 SBP: 183 mmHg
Stage 5 Speed: 2.5 mph
Stage 6 Grade: 14 %
Stage 6 HR: 155 {beats}/min
Stage 6 SBP: 213 mmHg
Stage 7 HR: 155 {beats}/min
Stage 7 Speed: 3.4 mph
Stage 8 Speed: 1.5 mph
Stage 9 HR: 102 {beats}/min
Stage 9 SBP: 156 mmHg
Stage 9 Speed: 0 mph

## 2015-11-27 DIAGNOSIS — H2513 Age-related nuclear cataract, bilateral: Secondary | ICD-10-CM | POA: Diagnosis not present

## 2015-11-27 DIAGNOSIS — H04123 Dry eye syndrome of bilateral lacrimal glands: Secondary | ICD-10-CM | POA: Diagnosis not present

## 2015-11-27 DIAGNOSIS — H01021 Squamous blepharitis right upper eyelid: Secondary | ICD-10-CM | POA: Diagnosis not present

## 2015-11-27 DIAGNOSIS — H01024 Squamous blepharitis left upper eyelid: Secondary | ICD-10-CM | POA: Diagnosis not present

## 2016-02-18 DIAGNOSIS — Z23 Encounter for immunization: Secondary | ICD-10-CM | POA: Diagnosis not present

## 2016-07-15 DIAGNOSIS — Z0000 Encounter for general adult medical examination without abnormal findings: Secondary | ICD-10-CM | POA: Diagnosis not present

## 2016-07-15 DIAGNOSIS — I10 Essential (primary) hypertension: Secondary | ICD-10-CM | POA: Diagnosis not present

## 2016-07-15 DIAGNOSIS — Z23 Encounter for immunization: Secondary | ICD-10-CM | POA: Diagnosis not present

## 2016-07-15 DIAGNOSIS — Z125 Encounter for screening for malignant neoplasm of prostate: Secondary | ICD-10-CM | POA: Diagnosis not present

## 2016-07-20 DIAGNOSIS — Z7982 Long term (current) use of aspirin: Secondary | ICD-10-CM | POA: Diagnosis not present

## 2016-07-20 DIAGNOSIS — I10 Essential (primary) hypertension: Secondary | ICD-10-CM | POA: Diagnosis not present

## 2016-07-20 DIAGNOSIS — Z1212 Encounter for screening for malignant neoplasm of rectum: Secondary | ICD-10-CM | POA: Diagnosis not present

## 2016-07-20 DIAGNOSIS — Z0001 Encounter for general adult medical examination with abnormal findings: Secondary | ICD-10-CM | POA: Diagnosis not present

## 2016-07-20 DIAGNOSIS — Z880 Allergy status to penicillin: Secondary | ICD-10-CM | POA: Diagnosis not present

## 2016-08-24 DIAGNOSIS — I10 Essential (primary) hypertension: Secondary | ICD-10-CM | POA: Diagnosis not present

## 2016-08-24 DIAGNOSIS — Z8719 Personal history of other diseases of the digestive system: Secondary | ICD-10-CM | POA: Diagnosis not present

## 2016-08-24 DIAGNOSIS — N400 Enlarged prostate without lower urinary tract symptoms: Secondary | ICD-10-CM | POA: Diagnosis not present

## 2016-09-28 DIAGNOSIS — R319 Hematuria, unspecified: Secondary | ICD-10-CM | POA: Diagnosis not present

## 2016-09-28 DIAGNOSIS — R361 Hematospermia: Secondary | ICD-10-CM | POA: Diagnosis not present

## 2016-09-28 DIAGNOSIS — N390 Urinary tract infection, site not specified: Secondary | ICD-10-CM | POA: Diagnosis not present

## 2016-09-30 DIAGNOSIS — R310 Gross hematuria: Secondary | ICD-10-CM | POA: Diagnosis not present

## 2016-09-30 DIAGNOSIS — N400 Enlarged prostate without lower urinary tract symptoms: Secondary | ICD-10-CM | POA: Diagnosis not present

## 2016-09-30 DIAGNOSIS — R361 Hematospermia: Secondary | ICD-10-CM | POA: Diagnosis not present

## 2016-10-11 DIAGNOSIS — N200 Calculus of kidney: Secondary | ICD-10-CM | POA: Diagnosis not present

## 2016-10-11 DIAGNOSIS — R310 Gross hematuria: Secondary | ICD-10-CM | POA: Diagnosis not present

## 2016-10-19 DIAGNOSIS — R310 Gross hematuria: Secondary | ICD-10-CM | POA: Diagnosis not present

## 2016-10-19 DIAGNOSIS — N200 Calculus of kidney: Secondary | ICD-10-CM | POA: Diagnosis not present

## 2016-12-16 DIAGNOSIS — H2513 Age-related nuclear cataract, bilateral: Secondary | ICD-10-CM | POA: Diagnosis not present

## 2016-12-16 DIAGNOSIS — H04123 Dry eye syndrome of bilateral lacrimal glands: Secondary | ICD-10-CM | POA: Diagnosis not present

## 2016-12-16 DIAGNOSIS — H43813 Vitreous degeneration, bilateral: Secondary | ICD-10-CM | POA: Diagnosis not present

## 2017-01-19 DIAGNOSIS — H01021 Squamous blepharitis right upper eyelid: Secondary | ICD-10-CM | POA: Diagnosis not present

## 2017-01-19 DIAGNOSIS — H01024 Squamous blepharitis left upper eyelid: Secondary | ICD-10-CM | POA: Diagnosis not present

## 2017-01-19 DIAGNOSIS — H16223 Keratoconjunctivitis sicca, not specified as Sjogren's, bilateral: Secondary | ICD-10-CM | POA: Diagnosis not present

## 2017-01-19 DIAGNOSIS — H04123 Dry eye syndrome of bilateral lacrimal glands: Secondary | ICD-10-CM | POA: Diagnosis not present

## 2017-02-03 DIAGNOSIS — M542 Cervicalgia: Secondary | ICD-10-CM | POA: Diagnosis not present

## 2017-02-03 DIAGNOSIS — Z23 Encounter for immunization: Secondary | ICD-10-CM | POA: Diagnosis not present

## 2017-02-11 DIAGNOSIS — M542 Cervicalgia: Secondary | ICD-10-CM | POA: Diagnosis not present

## 2017-02-17 ENCOUNTER — Other Ambulatory Visit: Payer: Self-pay | Admitting: Otolaryngology

## 2017-02-17 DIAGNOSIS — M542 Cervicalgia: Secondary | ICD-10-CM

## 2017-02-23 ENCOUNTER — Ambulatory Visit
Admission: RE | Admit: 2017-02-23 | Discharge: 2017-02-23 | Disposition: A | Discharge status: Home / Self Care | Payer: PPO | Source: Ambulatory Visit | Attending: Otolaryngology | Admitting: Otolaryngology

## 2017-02-23 DIAGNOSIS — M542 Cervicalgia: Secondary | ICD-10-CM

## 2017-02-23 MED ORDER — IOPAMIDOL (ISOVUE-300) INJECTION 61%
75.0000 mL | Freq: Once | INTRAVENOUS | Status: AC | PRN
  Administered 2017-02-23: 75 mL via INTRAVENOUS

## 2017-03-24 DIAGNOSIS — H01021 Squamous blepharitis right upper eyelid: Secondary | ICD-10-CM | POA: Diagnosis not present

## 2017-03-24 DIAGNOSIS — H01024 Squamous blepharitis left upper eyelid: Secondary | ICD-10-CM | POA: Diagnosis not present

## 2017-03-24 DIAGNOSIS — H16223 Keratoconjunctivitis sicca, not specified as Sjogren's, bilateral: Secondary | ICD-10-CM | POA: Diagnosis not present

## 2017-07-26 DIAGNOSIS — Z125 Encounter for screening for malignant neoplasm of prostate: Secondary | ICD-10-CM | POA: Diagnosis not present

## 2017-07-26 DIAGNOSIS — Z7982 Long term (current) use of aspirin: Secondary | ICD-10-CM | POA: Diagnosis not present

## 2017-07-26 DIAGNOSIS — K219 Gastro-esophageal reflux disease without esophagitis: Secondary | ICD-10-CM | POA: Diagnosis not present

## 2017-07-26 DIAGNOSIS — E7800 Pure hypercholesterolemia, unspecified: Secondary | ICD-10-CM | POA: Diagnosis not present

## 2017-07-29 DIAGNOSIS — K5790 Diverticulosis of intestine, part unspecified, without perforation or abscess without bleeding: Secondary | ICD-10-CM | POA: Diagnosis not present

## 2017-07-29 DIAGNOSIS — N400 Enlarged prostate without lower urinary tract symptoms: Secondary | ICD-10-CM | POA: Diagnosis not present

## 2017-07-29 DIAGNOSIS — M6208 Separation of muscle (nontraumatic), other site: Secondary | ICD-10-CM | POA: Diagnosis not present

## 2017-07-29 DIAGNOSIS — I10 Essential (primary) hypertension: Secondary | ICD-10-CM | POA: Diagnosis not present

## 2017-07-29 DIAGNOSIS — M19041 Primary osteoarthritis, right hand: Secondary | ICD-10-CM | POA: Diagnosis not present

## 2017-07-29 DIAGNOSIS — M19042 Primary osteoarthritis, left hand: Secondary | ICD-10-CM | POA: Diagnosis not present

## 2017-07-29 DIAGNOSIS — E782 Mixed hyperlipidemia: Secondary | ICD-10-CM | POA: Diagnosis not present

## 2017-07-29 DIAGNOSIS — Z0001 Encounter for general adult medical examination with abnormal findings: Secondary | ICD-10-CM | POA: Diagnosis not present

## 2017-07-29 DIAGNOSIS — B360 Pityriasis versicolor: Secondary | ICD-10-CM | POA: Diagnosis not present

## 2017-07-29 DIAGNOSIS — Z7982 Long term (current) use of aspirin: Secondary | ICD-10-CM | POA: Diagnosis not present

## 2017-07-29 DIAGNOSIS — K219 Gastro-esophageal reflux disease without esophagitis: Secondary | ICD-10-CM | POA: Diagnosis not present

## 2017-07-29 DIAGNOSIS — G5600 Carpal tunnel syndrome, unspecified upper limb: Secondary | ICD-10-CM | POA: Diagnosis not present

## 2018-01-31 DIAGNOSIS — Z23 Encounter for immunization: Secondary | ICD-10-CM | POA: Diagnosis not present

## 2018-07-20 IMAGING — CT CT NECK W/ CM
2 of 4 series · 6 of 14 positions shown, 7 images · IV contrast (iopamidol)
Comparison: None.

CLINICAL DATA: Left neck pain

EXAM:
CT NECK WITH CONTRAST
TECHNIQUE: Multidetector CT imaging of the neck was performed using the
standard protocol following the bolus administration of intravenous
contrast.
CONTRAST:  75mL J95T9Y-LNN IOPAMIDOL (J95T9Y-LNN) INJECTION 61%

[Series 3: neck · axial · 0.50mm/px · z∈[-268,-140]mm · 3 of 128 slices shown]
[im 32/128  bone]
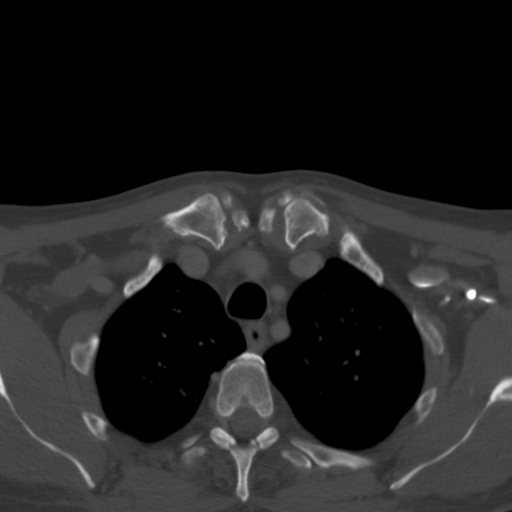
[im 64/128  bone]
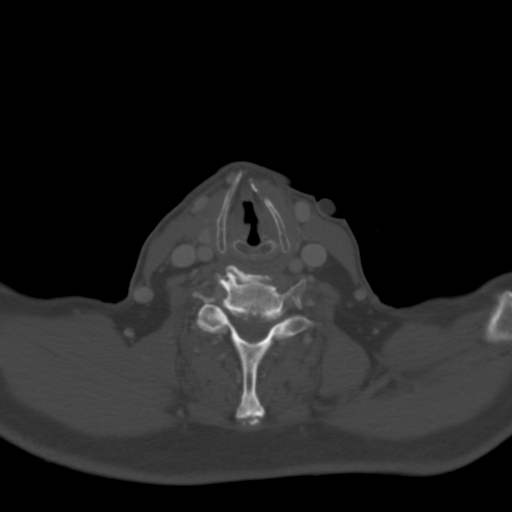
[im 96/128  bone]
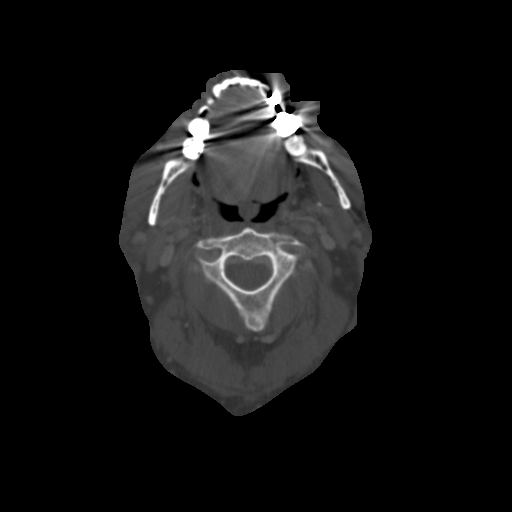

[Series 7: angled axial-oropharynx · axial · 0.39mm/px · z∈[-307,-171]mm · 3 of 145 slices shown, 4 images]
[im 37/145  soft-tissue]
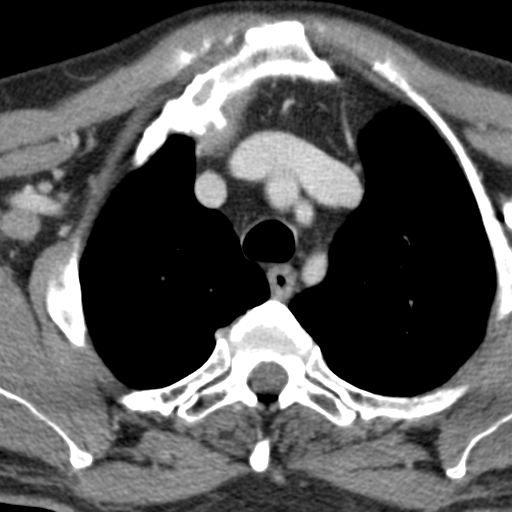
[im 37/145  bone]
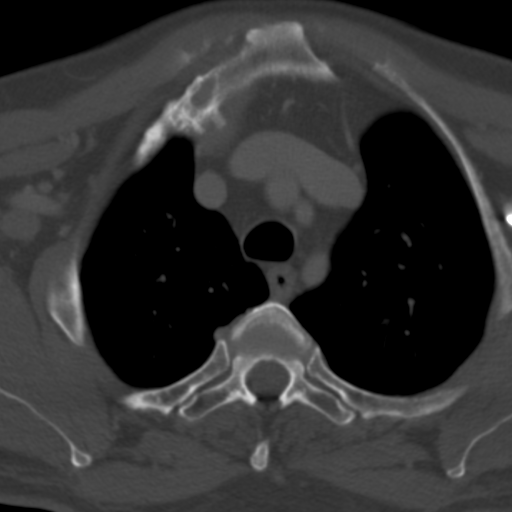
[im 73/145  bone]
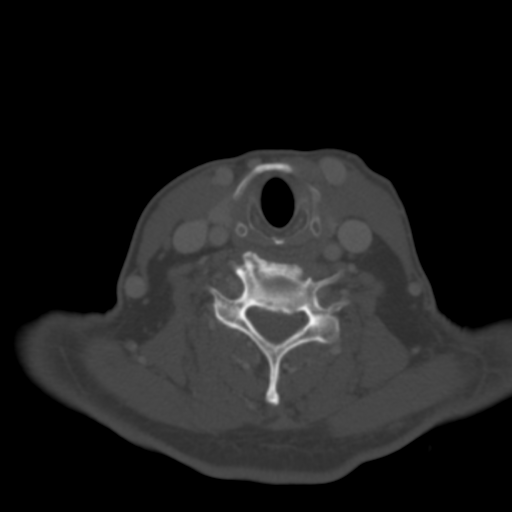
[im 109/145  bone]
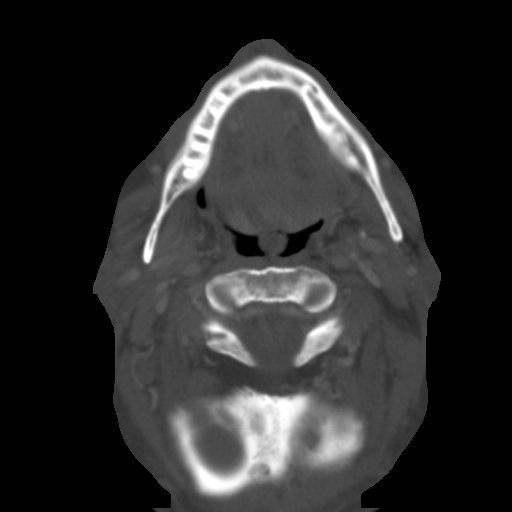

[6 of 14 positions shown; findings below may reference images not displayed]

FINDINGS: Pharynx and larynx: Normal. No mass or swelling.

Salivary glands: No inflammation, mass, or stone.

Thyroid: Negative

Lymph nodes: No pathologic or enlarged lymph nodes.

Vascular: Negative

Limited intracranial: Negative

Visualized orbits: Negative

Mastoids and visualized paranasal sinuses: Negative

Skeleton: Mild periapical lucency around left upper molar compatible
with dental infection. Cervical spondylosis.

Upper chest: Negative

Other: Negative
IMPRESSION: Negative for mass or adenopathy

Periapical lucency around left upper molar compatible with dental
infection.

## 2018-08-02 DIAGNOSIS — Z7982 Long term (current) use of aspirin: Secondary | ICD-10-CM | POA: Diagnosis not present

## 2018-08-02 DIAGNOSIS — Z125 Encounter for screening for malignant neoplasm of prostate: Secondary | ICD-10-CM | POA: Diagnosis not present

## 2018-08-02 DIAGNOSIS — Z1159 Encounter for screening for other viral diseases: Secondary | ICD-10-CM | POA: Diagnosis not present

## 2018-08-02 DIAGNOSIS — I10 Essential (primary) hypertension: Secondary | ICD-10-CM | POA: Diagnosis not present

## 2018-08-02 DIAGNOSIS — K219 Gastro-esophageal reflux disease without esophagitis: Secondary | ICD-10-CM | POA: Diagnosis not present

## 2018-08-07 DIAGNOSIS — Z0001 Encounter for general adult medical examination with abnormal findings: Secondary | ICD-10-CM | POA: Diagnosis not present

## 2018-08-07 DIAGNOSIS — H04123 Dry eye syndrome of bilateral lacrimal glands: Secondary | ICD-10-CM | POA: Diagnosis not present

## 2018-08-07 DIAGNOSIS — K429 Umbilical hernia without obstruction or gangrene: Secondary | ICD-10-CM | POA: Diagnosis not present

## 2018-08-07 DIAGNOSIS — I10 Essential (primary) hypertension: Secondary | ICD-10-CM | POA: Diagnosis not present

## 2018-08-07 DIAGNOSIS — M6208 Separation of muscle (nontraumatic), other site: Secondary | ICD-10-CM | POA: Diagnosis not present

## 2018-08-07 DIAGNOSIS — Z7982 Long term (current) use of aspirin: Secondary | ICD-10-CM | POA: Diagnosis not present

## 2018-08-07 DIAGNOSIS — N400 Enlarged prostate without lower urinary tract symptoms: Secondary | ICD-10-CM | POA: Diagnosis not present

## 2018-08-07 DIAGNOSIS — E782 Mixed hyperlipidemia: Secondary | ICD-10-CM | POA: Diagnosis not present

## 2018-08-07 DIAGNOSIS — K219 Gastro-esophageal reflux disease without esophagitis: Secondary | ICD-10-CM | POA: Diagnosis not present

## 2018-08-07 DIAGNOSIS — R972 Elevated prostate specific antigen [PSA]: Secondary | ICD-10-CM | POA: Diagnosis not present

## 2018-08-31 DIAGNOSIS — R972 Elevated prostate specific antigen [PSA]: Secondary | ICD-10-CM | POA: Diagnosis not present

## 2018-08-31 DIAGNOSIS — N400 Enlarged prostate without lower urinary tract symptoms: Secondary | ICD-10-CM | POA: Diagnosis not present

## 2018-11-30 DIAGNOSIS — R972 Elevated prostate specific antigen [PSA]: Secondary | ICD-10-CM | POA: Diagnosis not present

## 2019-01-10 DIAGNOSIS — Z23 Encounter for immunization: Secondary | ICD-10-CM | POA: Diagnosis not present

## 2019-02-20 DIAGNOSIS — R972 Elevated prostate specific antigen [PSA]: Secondary | ICD-10-CM | POA: Diagnosis not present

## 2019-02-27 DIAGNOSIS — R972 Elevated prostate specific antigen [PSA]: Secondary | ICD-10-CM | POA: Diagnosis not present

## 2019-02-27 DIAGNOSIS — N400 Enlarged prostate without lower urinary tract symptoms: Secondary | ICD-10-CM | POA: Diagnosis not present

## 2019-08-06 DIAGNOSIS — I10 Essential (primary) hypertension: Secondary | ICD-10-CM | POA: Diagnosis not present

## 2019-08-06 DIAGNOSIS — Z125 Encounter for screening for malignant neoplasm of prostate: Secondary | ICD-10-CM | POA: Diagnosis not present

## 2019-08-06 DIAGNOSIS — E7800 Pure hypercholesterolemia, unspecified: Secondary | ICD-10-CM | POA: Diagnosis not present

## 2019-08-13 DIAGNOSIS — Z0001 Encounter for general adult medical examination with abnormal findings: Secondary | ICD-10-CM | POA: Diagnosis not present

## 2019-08-13 DIAGNOSIS — K219 Gastro-esophageal reflux disease without esophagitis: Secondary | ICD-10-CM | POA: Diagnosis not present

## 2019-08-13 DIAGNOSIS — R972 Elevated prostate specific antigen [PSA]: Secondary | ICD-10-CM | POA: Diagnosis not present

## 2019-08-13 DIAGNOSIS — J302 Other seasonal allergic rhinitis: Secondary | ICD-10-CM | POA: Diagnosis not present

## 2019-08-13 DIAGNOSIS — R5381 Other malaise: Secondary | ICD-10-CM | POA: Diagnosis not present

## 2019-08-13 DIAGNOSIS — I10 Essential (primary) hypertension: Secondary | ICD-10-CM | POA: Diagnosis not present

## 2019-08-13 DIAGNOSIS — E782 Mixed hyperlipidemia: Secondary | ICD-10-CM | POA: Diagnosis not present

## 2019-08-27 DIAGNOSIS — R972 Elevated prostate specific antigen [PSA]: Secondary | ICD-10-CM | POA: Diagnosis not present

## 2020-01-11 DIAGNOSIS — Z23 Encounter for immunization: Secondary | ICD-10-CM | POA: Diagnosis not present

## 2020-02-18 DIAGNOSIS — R972 Elevated prostate specific antigen [PSA]: Secondary | ICD-10-CM | POA: Diagnosis not present

## 2020-02-18 DIAGNOSIS — N400 Enlarged prostate without lower urinary tract symptoms: Secondary | ICD-10-CM | POA: Diagnosis not present

## 2020-08-11 DIAGNOSIS — R972 Elevated prostate specific antigen [PSA]: Secondary | ICD-10-CM | POA: Diagnosis not present

## 2020-08-13 DIAGNOSIS — Z125 Encounter for screening for malignant neoplasm of prostate: Secondary | ICD-10-CM | POA: Diagnosis not present

## 2020-08-13 DIAGNOSIS — I10 Essential (primary) hypertension: Secondary | ICD-10-CM | POA: Diagnosis not present

## 2020-08-13 DIAGNOSIS — E7801 Familial hypercholesterolemia: Secondary | ICD-10-CM | POA: Diagnosis not present

## 2020-08-18 DIAGNOSIS — E7800 Pure hypercholesterolemia, unspecified: Secondary | ICD-10-CM | POA: Diagnosis not present

## 2020-08-18 DIAGNOSIS — R972 Elevated prostate specific antigen [PSA]: Secondary | ICD-10-CM | POA: Diagnosis not present

## 2020-08-18 DIAGNOSIS — I10 Essential (primary) hypertension: Secondary | ICD-10-CM | POA: Diagnosis not present

## 2020-08-18 DIAGNOSIS — J302 Other seasonal allergic rhinitis: Secondary | ICD-10-CM | POA: Diagnosis not present

## 2020-08-18 DIAGNOSIS — N401 Enlarged prostate with lower urinary tract symptoms: Secondary | ICD-10-CM | POA: Diagnosis not present

## 2020-08-18 DIAGNOSIS — Z0001 Encounter for general adult medical examination with abnormal findings: Secondary | ICD-10-CM | POA: Diagnosis not present

## 2020-08-18 DIAGNOSIS — K219 Gastro-esophageal reflux disease without esophagitis: Secondary | ICD-10-CM | POA: Diagnosis not present

## 2020-08-18 DIAGNOSIS — N3943 Post-void dribbling: Secondary | ICD-10-CM | POA: Diagnosis not present

## 2020-08-18 DIAGNOSIS — K429 Umbilical hernia without obstruction or gangrene: Secondary | ICD-10-CM | POA: Diagnosis not present

## 2020-08-19 ENCOUNTER — Other Ambulatory Visit: Payer: Self-pay | Admitting: Internal Medicine

## 2020-08-19 DIAGNOSIS — E7800 Pure hypercholesterolemia, unspecified: Secondary | ICD-10-CM

## 2020-08-22 DIAGNOSIS — E7800 Pure hypercholesterolemia, unspecified: Secondary | ICD-10-CM | POA: Diagnosis not present

## 2020-08-22 DIAGNOSIS — I10 Essential (primary) hypertension: Secondary | ICD-10-CM | POA: Diagnosis not present

## 2020-09-09 ENCOUNTER — Ambulatory Visit
Admission: RE | Admit: 2020-09-09 | Discharge: 2020-09-09 | Disposition: A | Discharge status: Home / Self Care | Payer: Self-pay | Source: Ambulatory Visit | Attending: Internal Medicine | Admitting: Internal Medicine

## 2020-09-09 DIAGNOSIS — E7800 Pure hypercholesterolemia, unspecified: Secondary | ICD-10-CM

## 2020-09-18 DIAGNOSIS — R3912 Poor urinary stream: Secondary | ICD-10-CM | POA: Diagnosis not present

## 2020-09-18 DIAGNOSIS — R972 Elevated prostate specific antigen [PSA]: Secondary | ICD-10-CM | POA: Diagnosis not present

## 2020-09-18 DIAGNOSIS — N401 Enlarged prostate with lower urinary tract symptoms: Secondary | ICD-10-CM | POA: Diagnosis not present

## 2020-09-22 DIAGNOSIS — I2584 Coronary atherosclerosis due to calcified coronary lesion: Secondary | ICD-10-CM | POA: Diagnosis not present

## 2020-09-22 DIAGNOSIS — I2510 Atherosclerotic heart disease of native coronary artery without angina pectoris: Secondary | ICD-10-CM | POA: Diagnosis not present

## 2020-09-22 DIAGNOSIS — I10 Essential (primary) hypertension: Secondary | ICD-10-CM | POA: Diagnosis not present

## 2021-01-16 DIAGNOSIS — Z23 Encounter for immunization: Secondary | ICD-10-CM | POA: Diagnosis not present

## 2021-08-17 DIAGNOSIS — E7801 Familial hypercholesterolemia: Secondary | ICD-10-CM | POA: Diagnosis not present

## 2021-08-17 DIAGNOSIS — Z125 Encounter for screening for malignant neoplasm of prostate: Secondary | ICD-10-CM | POA: Diagnosis not present

## 2021-08-17 DIAGNOSIS — R972 Elevated prostate specific antigen [PSA]: Secondary | ICD-10-CM | POA: Diagnosis not present

## 2021-08-17 DIAGNOSIS — I10 Essential (primary) hypertension: Secondary | ICD-10-CM | POA: Diagnosis not present

## 2021-08-20 DIAGNOSIS — K219 Gastro-esophageal reflux disease without esophagitis: Secondary | ICD-10-CM | POA: Diagnosis not present

## 2021-08-20 DIAGNOSIS — I10 Essential (primary) hypertension: Secondary | ICD-10-CM | POA: Diagnosis not present

## 2021-08-20 DIAGNOSIS — R972 Elevated prostate specific antigen [PSA]: Secondary | ICD-10-CM | POA: Diagnosis not present

## 2021-08-20 DIAGNOSIS — G5603 Carpal tunnel syndrome, bilateral upper limbs: Secondary | ICD-10-CM | POA: Diagnosis not present

## 2021-08-20 DIAGNOSIS — N400 Enlarged prostate without lower urinary tract symptoms: Secondary | ICD-10-CM | POA: Diagnosis not present

## 2021-08-20 DIAGNOSIS — Z0000 Encounter for general adult medical examination without abnormal findings: Secondary | ICD-10-CM | POA: Diagnosis not present

## 2021-08-20 DIAGNOSIS — J302 Other seasonal allergic rhinitis: Secondary | ICD-10-CM | POA: Diagnosis not present

## 2021-08-20 DIAGNOSIS — I2510 Atherosclerotic heart disease of native coronary artery without angina pectoris: Secondary | ICD-10-CM | POA: Diagnosis not present

## 2021-08-20 DIAGNOSIS — Z1211 Encounter for screening for malignant neoplasm of colon: Secondary | ICD-10-CM | POA: Diagnosis not present

## 2021-09-16 DIAGNOSIS — Z1211 Encounter for screening for malignant neoplasm of colon: Secondary | ICD-10-CM | POA: Diagnosis not present

## 2021-09-24 DIAGNOSIS — Z1211 Encounter for screening for malignant neoplasm of colon: Secondary | ICD-10-CM | POA: Diagnosis not present

## 2022-01-08 DIAGNOSIS — Z23 Encounter for immunization: Secondary | ICD-10-CM | POA: Diagnosis not present

## 2022-06-13 ENCOUNTER — Ambulatory Visit
Admission: RE | Admit: 2022-06-13 | Discharge: 2022-06-13 | Disposition: A | Discharge status: Home / Self Care | Payer: PPO | Source: Ambulatory Visit

## 2022-06-13 VITALS — BP 134/75 | HR 71 | Temp 98.1°F | Resp 16 | Ht 65.0 in | Wt 140.0 lb

## 2022-06-13 DIAGNOSIS — H6122 Impacted cerumen, left ear: Secondary | ICD-10-CM

## 2022-06-13 NOTE — ED Provider Notes (Signed)
[facility]    CSN: [id number] Arrival date & time: 06/13/22  1146      History   Chief Complaint Chief Complaint  Patient presents with   Otalgia    HPI Timothy Maxwell is a 77 y.o. male.   Patient presents with left ear discomfort that has been present for about 10 days.  Patient reports that he does not have any pain but it feels like his ear is full.  He bought some over-the-counter "swimmers eardrops" with minimal improvement.  Denies trauma, foreign body, drainage from the ear.  Denies any associated upper respiratory symptoms, cough, fever.   Otalgia   Past Medical History:  Diagnosis Date   Arthralgia of multiple joints    Balanitis    BPH (benign prostatic hyperplasia)    Chronic headaches    CTS (carpal tunnel syndrome)    Diastasis recti    Diverticulosis    GERD (gastroesophageal reflux disease)    High cholesterol    History of Helicobacter infection    HLD (hyperlipidemia)    Hypertension    Lower back pain    Renal calculi    Tinea versicolor    Umbilical hernia     There are no problems to display for this patient.   Past Surgical History:  Procedure Laterality Date   TONSILLECTOMY         Home Medications    Prior to Admission medications   Medication Sig Start Date End Date Taking? Authorizing Provider  aspirin EC 81 MG tablet Take 81 mg by mouth daily.   Yes [provider]  atorvastatin (LIPITOR) 40 MG tablet Take 40 mg by mouth daily.   Yes [provider]  losartan-hydrochlorothiazide (HYZAAR) 100-12.5 MG tablet Take 1 tablet by mouth daily. 03/12/22  Yes [provider]  pantoprazole (PROTONIX) 40 MG tablet Take 40 mg by mouth daily.   Yes [provider]  tamsulosin (FLOMAX) 0.4 MG CAPS capsule Take 0.4 mg by mouth daily. 03/30/22  Yes [provider]  lisinopril (PRINIVIL,ZESTRIL) 20 MG tablet Take 20 mg by mouth daily.    [provider]  Multiple  Vitamins-Minerals (MULTIVITAMIN & MINERAL PO) Take 1 tablet by mouth daily.    [provider]    Family History Family History  Problem Relation Age of Onset   Heart disease Father    Emphysema Mother     Social History Social History   Tobacco Use   Smoking status: Never   Smokeless tobacco: Never  Vaping Use   Vaping Use: Never used  Substance Use Topics   Alcohol use: No    Alcohol/week: 0.0 standard drinks of alcohol   Drug use: No     Allergies   Penicillins   Review of Systems Review of Systems Per HPI  Physical Exam Triage Vital Signs ED Triage Vitals  Enc Vitals Group     BP 06/13/22 1248 134/75     Pulse Rate 06/13/22 1248 71     Resp 06/13/22 1248 16     Temp 06/13/22 1248 98.1 F (36.7 C)     Temp Source 06/13/22 1248 Oral     SpO2 06/13/22 1248 96 %     Weight 06/13/22 1250 140 lb (63.5 kg)     Height 06/13/22 1250 '5\' 5"'$  (1.651 m)     Head Circumference --      Peak Flow --      Pain Score 06/13/22 1250 1  Pain Loc --      Pain Edu? --      Excl. in [place]? --    No data found.  Updated Vital Signs BP 134/75 (BP Location: Left Arm)   Pulse 71   Temp 98.1 F (36.7 C) (Oral)   Resp 16   Ht '5\' 5"'$  (1.651 m)   Wt 140 lb (63.5 kg)   SpO2 96%   BMI 23.30 kg/m   Visual Acuity Right Eye Distance:   Left Eye Distance:   Bilateral Distance:    Right Eye Near:   Left Eye Near:    Bilateral Near:     Physical Exam Constitutional:      General: He is not in acute distress.    Appearance: Normal appearance. He is not toxic-appearing or diaphoretic.  HENT:     Head: Normocephalic and atraumatic.     Left Ear: Tympanic membrane normal. No drainage, swelling or tenderness.  No middle ear effusion. There is impacted cerumen. No mastoid tenderness. Tympanic membrane is not perforated, erythematous or bulging.     Ears:     Comments: Impacted cerumen to left external canal on original exam.  Ear was irrigated successfully.  On second  physical exam, ear appears normal. Eyes:     Extraocular Movements: Extraocular movements intact.     Conjunctiva/sclera: Conjunctivae normal.  Pulmonary:     Effort: Pulmonary effort is normal.  Neurological:     General: No focal deficit present.     Mental Status: He is alert and oriented to person, place, and time. Mental status is at baseline.  Psychiatric:        Mood and Affect: Mood normal.        Behavior: Behavior normal.        Thought Content: Thought content normal.        Judgment: Judgment normal.      UC Treatments / Results  Labs (all labs ordered are listed, but only abnormal results are displayed) Labs Reviewed - No data to display  EKG   Radiology No results found.  Procedures Procedures (including critical care time)  Medications Ordered in UC Medications - No data to display  Initial Impression / Assessment and Plan / UC Course  I have reviewed the triage vital signs and the nursing notes.  Pertinent labs & imaging results that were available during my care of the patient were reviewed by me and considered in my medical decision making (see chart for details).     Patient with impacted cerumen which was successfully removed with ear irrigation to the left ear.  On second physical exam, ear and TM appears intact and normal.  Advised strict return precautions.  Patient verbalized understanding and is agreeable with plan. Final Clinical Impressions(s) / UC Diagnoses   Final diagnoses:  Impacted cerumen of left ear     Discharge Instructions      Ear has been washed out.  Follow-up if any symptoms persist or worsen.    ED Prescriptions   None    PDMP not reviewed this encounter.   Teodora Medici, [place] 06/13/22 1327

## 2022-06-13 NOTE — Discharge Instructions (Signed)
Ear has been washed out.  Follow-up if any symptoms persist or worsen.

## 2022-06-13 NOTE — ED Triage Notes (Signed)
Patient c/o left ear pain x 10 days.  Patient did buy OTC swimmers ear drops w/o relief.

## 2022-10-14 DIAGNOSIS — I10 Essential (primary) hypertension: Secondary | ICD-10-CM | POA: Diagnosis not present

## 2022-10-14 DIAGNOSIS — E782 Mixed hyperlipidemia: Secondary | ICD-10-CM | POA: Diagnosis not present

## 2022-10-14 DIAGNOSIS — R5383 Other fatigue: Secondary | ICD-10-CM | POA: Diagnosis not present

## 2022-10-14 DIAGNOSIS — Z0001 Encounter for general adult medical examination with abnormal findings: Secondary | ICD-10-CM | POA: Diagnosis not present

## 2022-10-21 DIAGNOSIS — I10 Essential (primary) hypertension: Secondary | ICD-10-CM | POA: Diagnosis not present

## 2022-10-21 DIAGNOSIS — R972 Elevated prostate specific antigen [PSA]: Secondary | ICD-10-CM | POA: Diagnosis not present

## 2022-10-21 DIAGNOSIS — Z1212 Encounter for screening for malignant neoplasm of rectum: Secondary | ICD-10-CM | POA: Diagnosis not present

## 2022-10-21 DIAGNOSIS — Z23 Encounter for immunization: Secondary | ICD-10-CM | POA: Diagnosis not present

## 2022-10-21 DIAGNOSIS — J302 Other seasonal allergic rhinitis: Secondary | ICD-10-CM | POA: Diagnosis not present

## 2022-10-21 DIAGNOSIS — M159 Polyosteoarthritis, unspecified: Secondary | ICD-10-CM | POA: Diagnosis not present

## 2022-10-21 DIAGNOSIS — Z0000 Encounter for general adult medical examination without abnormal findings: Secondary | ICD-10-CM | POA: Diagnosis not present

## 2022-10-21 DIAGNOSIS — R5381 Other malaise: Secondary | ICD-10-CM | POA: Diagnosis not present

## 2022-10-21 DIAGNOSIS — N401 Enlarged prostate with lower urinary tract symptoms: Secondary | ICD-10-CM | POA: Diagnosis not present

## 2022-10-21 DIAGNOSIS — I2510 Atherosclerotic heart disease of native coronary artery without angina pectoris: Secondary | ICD-10-CM | POA: Diagnosis not present

## 2022-10-23 ENCOUNTER — Other Ambulatory Visit: Payer: Self-pay

## 2022-10-23 ENCOUNTER — Emergency Department (HOSPITAL_COMMUNITY)
Admission: EM | Admit: 2022-10-23 | Discharge: 2022-10-23 | Disposition: A | Discharge status: Home / Self Care | Payer: PPO | Attending: Emergency Medicine | Admitting: Emergency Medicine

## 2022-10-23 DIAGNOSIS — Z87442 Personal history of urinary calculi: Secondary | ICD-10-CM | POA: Insufficient documentation

## 2022-10-23 DIAGNOSIS — D72829 Elevated white blood cell count, unspecified: Secondary | ICD-10-CM | POA: Insufficient documentation

## 2022-10-23 DIAGNOSIS — Z7982 Long term (current) use of aspirin: Secondary | ICD-10-CM | POA: Insufficient documentation

## 2022-10-23 DIAGNOSIS — R339 Retention of urine, unspecified: Secondary | ICD-10-CM

## 2022-10-23 LAB — CBC WITH DIFFERENTIAL/PLATELET
Abs Immature Granulocytes: 0.03 10*3/uL (ref 0.00–0.07)
Basophils Absolute: 0 10*3/uL (ref 0.0–0.1)
Basophils Relative: 0 %
Eosinophils Absolute: 0 10*3/uL (ref 0.0–0.5)
Eosinophils Relative: 0 %
HCT: 43.5 % (ref 39.0–52.0)
Hemoglobin: 14.3 g/dL (ref 13.0–17.0)
Immature Granulocytes: 0 %
Lymphocytes Relative: 7 %
Lymphs Abs: 0.9 10*3/uL (ref 0.7–4.0)
MCH: 31.2 pg (ref 26.0–34.0)
MCHC: 32.9 g/dL (ref 30.0–36.0)
MCV: 94.8 fL (ref 80.0–100.0)
Monocytes Absolute: 0.9 10*3/uL (ref 0.1–1.0)
Monocytes Relative: 7 %
Neutro Abs: 11.2 10*3/uL — ABNORMAL HIGH (ref 1.7–7.7)
Neutrophils Relative %: 86 %
Platelets: 226 10*3/uL (ref 150–400)
RBC: 4.59 MIL/uL (ref 4.22–5.81)
RDW: 12.6 % (ref 11.5–15.5)
WBC: 13.1 10*3/uL — ABNORMAL HIGH (ref 4.0–10.5)
nRBC: 0 % (ref 0.0–0.2)

## 2022-10-23 LAB — BASIC METABOLIC PANEL
Anion gap: 9 (ref 5–15)
BUN: 18 mg/dL (ref 8–23)
CO2: 23 mmol/L (ref 22–32)
Calcium: 9.2 mg/dL (ref 8.9–10.3)
Chloride: 104 mmol/L (ref 98–111)
Creatinine, Ser: 0.93 mg/dL (ref 0.61–1.24)
GFR, Estimated: >60 mL/min (ref >60)
Glucose, Bld: 124 mg/dL — ABNORMAL HIGH (ref 70–99)
Potassium: 3.8 mmol/L (ref 3.5–5.1)
Sodium: 136 mmol/L (ref 135–145)

## 2022-10-23 MED ORDER — LIDOCAINE HCL URETHRAL/MUCOSAL 2 % EX GEL
1.0000 | Freq: Once | CUTANEOUS | Status: DC | PRN
  Filled 2022-10-23: qty 30

## 2022-10-23 MED ORDER — LIDOCAINE HCL URETHRAL/MUCOSAL 2 % EX GEL
1.0000 | Freq: Once | CUTANEOUS | Status: AC | PRN
  Administered 2022-10-23: 1 via URETHRAL
  Filled 2022-10-23: qty 11

## 2022-10-23 MED ORDER — SULFAMETHOXAZOLE-TRIMETHOPRIM 800-160 MG PO TABS: 1.0000 | ORAL_TABLET | Freq: Two times a day (BID) | ORAL | 0 refills | Status: AC

## 2022-10-23 NOTE — ED Triage Notes (Signed)
Pt arrived via POV. C/o urinary retention since this AM. Bladder scanner showed abt   [id number]

## 2022-10-23 NOTE — Discharge Instructions (Addendum)
You have been seen and discharged from the emergency department.  You were found to have urinary retention requiring a Foley.  This Foley will remain in place until you follow-up with urology in the office.  Your blood work looked normal, kidney function was normal.   Take antibiotic as directed.   Follow-up with your primary provider for further evaluation and further care. Take home medications as prescribed. If you have any worsening symptoms or further concerns for your health please return to an emergency department for further evaluation.

## 2022-10-23 NOTE — ED Provider Notes (Signed)
EMERGENCY DEPARTMENT AT [facility] Provider Note   CSN: [id number] Arrival date & time: 10/23/22  1840     History  Chief Complaint  Patient presents with   Urinary Retention    Timothy Maxwell is a 77 y.o. male.  HPI   77 year old male presents emergency department with inability to urinate.  Patient states this has happened briefly in the past but usually self resolved.  The last time the patient was able to urinate was last night.  He feels like he had a small amount of urine passed this morning but since then has been unable to void.  He is complaining of worsening suprapubic discomfort.  Has history of kidney stones and BPH but has never followed with urology.  He denies any penile or testicular swelling.  No flank pain or nausea/vomiting. No fever or new medications.  Home Medications Prior to Admission medications   Medication Sig Start Date End Date Taking? Authorizing Provider  aspirin EC 81 MG tablet Take 81 mg by mouth daily.    [provider]  atorvastatin (LIPITOR) 40 MG tablet Take 40 mg by mouth daily.    [provider]  lisinopril (PRINIVIL,ZESTRIL) 20 MG tablet Take 20 mg by mouth daily.    [provider]  losartan-hydrochlorothiazide (HYZAAR) 100-12.5 MG tablet Take 1 tablet by mouth daily. 03/12/22   [provider]  Multiple Vitamins-Minerals (MULTIVITAMIN & MINERAL PO) Take 1 tablet by mouth daily.    [provider]  pantoprazole (PROTONIX) 40 MG tablet Take 40 mg by mouth daily.    [provider]  tamsulosin (FLOMAX) 0.4 MG CAPS capsule Take 0.4 mg by mouth daily. 03/30/22   [provider]      Allergies    Penicillins    Review of Systems   Review of Systems  Constitutional:  Negative for fever.  Respiratory:  Negative for shortness of breath.   Cardiovascular:  Negative for chest pain.  Gastrointestinal:  Negative for abdominal pain, diarrhea and vomiting.   Genitourinary:  Positive for difficulty urinating and urgency. Negative for flank pain, hematuria, penile swelling, scrotal swelling and testicular pain.  Skin:  Negative for rash.  Neurological:  Negative for headaches.    Physical Exam Updated Vital Signs BP (!) 148/89 (BP Location: Right Arm)   Pulse 78   Temp 98.9 F (37.2 C) (Oral)   Resp 18   Ht 5\' 5"  (1.651 m)   Wt 63.5 kg   SpO2 99%   BMI 23.30 kg/m  Physical Exam Vitals and nursing note reviewed.  Constitutional:      Appearance: Normal appearance.  HENT:     Head: Normocephalic.     Mouth/Throat:     Mouth: Mucous membranes are moist.  Cardiovascular:     Rate and Rhythm: Normal rate.  Pulmonary:     Effort: Pulmonary effort is normal. No respiratory distress.  Abdominal:     Palpations: Abdomen is soft.     Comments: + suprapubic pain  Skin:    General: Skin is warm.  Neurological:     Mental Status: He is alert and oriented to person, place, and time. Mental status is at baseline.  Psychiatric:        Mood and Affect: Mood normal.     ED Results / Procedures / Treatments   Labs (all labs ordered are listed, but only abnormal results are displayed) Labs Reviewed  CBC WITH DIFFERENTIAL/PLATELET - Abnormal; Notable for the  following components:      Result Value   WBC 13.1 (*)    Neutro Abs 11.2 (*)    All other components within normal limits  URINALYSIS, ROUTINE W REFLEX MICROSCOPIC  BASIC METABOLIC PANEL    EKG None  Radiology No results found.  Procedures Procedures    Medications Ordered in ED Medications  lidocaine (XYLOCAINE) 2 % jelly 1 Application (1 Application Urethral Given 10/23/22 1948)    ED Course/ Medical Decision Making/ A&P                             Medical Decision Making Amount and/or Complexity of Data Reviewed Labs: ordered.   77 year old male presents emergency department with concern for urinary retention.  Bedside bladder scan shows about 600 cc of  urine.  Patient was a difficult Foley but coud was placed with drainage of about 700 cc of bloody urine.  The Foley was irrigated, no clots but continues to be red-tinged.  Blood work is reassuring, mild leukocytosis, kidney function is normal.  Did not obtain urinalysis given the degree of frank hematuria but we will go ahead and treat for possible UTI.  Patient will be changed to leg bag, told to call urology on Monday for outpatient follow-up appointments.  He understands his instructions.  Will take his antibiotic and follow-up.  Patient at this time appears safe and stable for discharge and close outpatient follow up. Discharge plan and strict return to ED precautions discussed, patient verbalizes understanding and agreement.        Final Clinical Impression(s) / ED Diagnoses Final diagnoses:  None    Rx / DC Orders ED Discharge Orders     None         Rozelle Logan, DO 10/23/22 2256

## 2022-10-23 NOTE — ED Notes (Signed)
Pharmacy called to send lidojelly

## 2022-10-23 NOTE — ED Notes (Signed)
14 french cath [id number]

## 2022-10-23 NOTE — ED Notes (Addendum)
MD informed that [place] not inserting. Coude 16 inserted by Deon Pilling , RN with assistance of this RN and [place], Charity fundraiser. Initially no urine return due to large clot in tubing. Irrigation performed and clot passed. Frank blood and urine return. Balloon inflated and foley secured. Pt tolerated well.

## 2022-10-23 NOTE — ED Notes (Signed)
Leg bag applied and extensive teaching on foley care. Pt demonstrated that he knew how to empty. Urinal given to assist with emptying.

## 2022-10-24 LAB — URINALYSIS, ROUTINE W REFLEX MICROSCOPIC
Bacteria, UA: NONE SEEN
Bilirubin Urine: NEGATIVE
Glucose, UA: NEGATIVE mg/dL
Ketones, ur: 5 mg/dL — AB
Nitrite: NEGATIVE
Protein, ur: 100 mg/dL — AB
RBC / HPF: >50 RBC/hpf (ref 0–5)
Specific Gravity, Urine: 1.01 (ref 1.005–1.030)
WBC, UA: >50 WBC/hpf (ref 0–5)
pH: 6 (ref 5.0–8.0)

## 2022-10-26 ENCOUNTER — Telehealth: Payer: Self-pay

## 2022-10-26 NOTE — Telephone Encounter (Signed)
Transition Care Management Unsuccessful Follow-up Telephone Call  Date of discharge and from where:  Tim[facility] 7/20  Attempts:  1st Attempt  Reason for unsuccessful TCM follow-up call:  No answer/busy   Timothy Maxwell [facility], [facility] [phone] [address] [place], [place], [place] [address] Phone: [phone] Email: [email or web address]

## 2022-10-28 ENCOUNTER — Telehealth: Payer: Self-pay

## 2022-10-28 NOTE — Telephone Encounter (Signed)
Transition Care Management Unsuccessful Follow-up Telephone Call  Date of discharge and from where:  Timothy Maxwell 7/24  Attempts:  2nd  Reason for unsuccessful TCM follow-up call:  No answer/busy   Timothy Maxwell [facility] Guide, [facility] [phone] [address] [place], [place], [place] [address] Phone: [phone] Email: [email or web address]

## 2022-11-10 DIAGNOSIS — R338 Other retention of urine: Secondary | ICD-10-CM | POA: Diagnosis not present

## 2022-11-10 DIAGNOSIS — R310 Gross hematuria: Secondary | ICD-10-CM | POA: Diagnosis not present

## 2022-11-26 DIAGNOSIS — R338 Other retention of urine: Secondary | ICD-10-CM | POA: Diagnosis not present

## 2023-01-05 DIAGNOSIS — R3912 Poor urinary stream: Secondary | ICD-10-CM | POA: Diagnosis not present

## 2023-01-05 DIAGNOSIS — N401 Enlarged prostate with lower urinary tract symptoms: Secondary | ICD-10-CM | POA: Diagnosis not present

## 2023-01-05 DIAGNOSIS — R3914 Feeling of incomplete bladder emptying: Secondary | ICD-10-CM | POA: Diagnosis not present

## 2023-01-05 DIAGNOSIS — R972 Elevated prostate specific antigen [PSA]: Secondary | ICD-10-CM | POA: Diagnosis not present

## 2023-01-11 ENCOUNTER — Other Ambulatory Visit: Payer: Self-pay | Admitting: Urology

## 2023-01-11 DIAGNOSIS — R972 Elevated prostate specific antigen [PSA]: Secondary | ICD-10-CM

## 2023-01-21 DIAGNOSIS — Z23 Encounter for immunization: Secondary | ICD-10-CM | POA: Diagnosis not present

## 2023-02-20 ENCOUNTER — Ambulatory Visit
Admission: RE | Admit: 2023-02-20 | Discharge: 2023-02-20 | Disposition: A | Discharge status: Home / Self Care | Payer: PPO | Source: Ambulatory Visit | Attending: Urology | Admitting: Urology

## 2023-02-20 DIAGNOSIS — R972 Elevated prostate specific antigen [PSA]: Secondary | ICD-10-CM

## 2023-02-20 MED ORDER — GADOPICLENOL 0.5 MMOL/ML IV SOLN
7.5000 mL | Freq: Once | INTRAVENOUS | Status: AC | PRN
  Administered 2023-02-20: 7.5 mL via INTRAVENOUS

## 2023-03-17 DIAGNOSIS — N401 Enlarged prostate with lower urinary tract symptoms: Secondary | ICD-10-CM | POA: Diagnosis not present

## 2023-03-17 DIAGNOSIS — R972 Elevated prostate specific antigen [PSA]: Secondary | ICD-10-CM | POA: Diagnosis not present

## 2023-03-17 DIAGNOSIS — R3912 Poor urinary stream: Secondary | ICD-10-CM | POA: Diagnosis not present

## 2023-04-28 DIAGNOSIS — R972 Elevated prostate specific antigen [PSA]: Secondary | ICD-10-CM | POA: Diagnosis not present

## 2023-07-18 DIAGNOSIS — R338 Other retention of urine: Secondary | ICD-10-CM | POA: Diagnosis not present

## 2023-07-18 DIAGNOSIS — R3912 Poor urinary stream: Secondary | ICD-10-CM | POA: Diagnosis not present

## 2023-07-18 DIAGNOSIS — R3914 Feeling of incomplete bladder emptying: Secondary | ICD-10-CM | POA: Diagnosis not present

## 2023-07-18 DIAGNOSIS — N401 Enlarged prostate with lower urinary tract symptoms: Secondary | ICD-10-CM | POA: Diagnosis not present

## 2023-07-28 DIAGNOSIS — N401 Enlarged prostate with lower urinary tract symptoms: Secondary | ICD-10-CM | POA: Diagnosis not present

## 2023-07-28 DIAGNOSIS — R972 Elevated prostate specific antigen [PSA]: Secondary | ICD-10-CM | POA: Diagnosis not present

## 2023-07-28 DIAGNOSIS — R3914 Feeling of incomplete bladder emptying: Secondary | ICD-10-CM | POA: Diagnosis not present

## 2023-07-28 DIAGNOSIS — R3915 Urgency of urination: Secondary | ICD-10-CM | POA: Diagnosis not present

## 2023-08-25 DIAGNOSIS — R3914 Feeling of incomplete bladder emptying: Secondary | ICD-10-CM | POA: Diagnosis not present

## 2023-08-25 DIAGNOSIS — N401 Enlarged prostate with lower urinary tract symptoms: Secondary | ICD-10-CM | POA: Diagnosis not present

## 2023-09-22 DIAGNOSIS — B360 Pityriasis versicolor: Secondary | ICD-10-CM | POA: Diagnosis not present

## 2023-09-22 DIAGNOSIS — B3742 Candidal balanitis: Secondary | ICD-10-CM | POA: Diagnosis not present

## 2023-10-20 DIAGNOSIS — I10 Essential (primary) hypertension: Secondary | ICD-10-CM | POA: Diagnosis not present

## 2023-10-20 DIAGNOSIS — E7800 Pure hypercholesterolemia, unspecified: Secondary | ICD-10-CM | POA: Diagnosis not present

## 2023-10-25 DIAGNOSIS — K219 Gastro-esophageal reflux disease without esophagitis: Secondary | ICD-10-CM | POA: Diagnosis not present

## 2023-10-25 DIAGNOSIS — R972 Elevated prostate specific antigen [PSA]: Secondary | ICD-10-CM | POA: Diagnosis not present

## 2023-10-25 DIAGNOSIS — J302 Other seasonal allergic rhinitis: Secondary | ICD-10-CM | POA: Diagnosis not present

## 2023-10-25 DIAGNOSIS — E782 Mixed hyperlipidemia: Secondary | ICD-10-CM | POA: Diagnosis not present

## 2023-10-25 DIAGNOSIS — I10 Essential (primary) hypertension: Secondary | ICD-10-CM | POA: Diagnosis not present

## 2023-10-25 DIAGNOSIS — Z0000 Encounter for general adult medical examination without abnormal findings: Secondary | ICD-10-CM | POA: Diagnosis not present

## 2023-10-25 DIAGNOSIS — I2510 Atherosclerotic heart disease of native coronary artery without angina pectoris: Secondary | ICD-10-CM | POA: Diagnosis not present

## 2023-10-25 DIAGNOSIS — N401 Enlarged prostate with lower urinary tract symptoms: Secondary | ICD-10-CM | POA: Diagnosis not present

## 2023-10-25 DIAGNOSIS — R634 Abnormal weight loss: Secondary | ICD-10-CM | POA: Diagnosis not present

## 2023-12-30 DIAGNOSIS — Z23 Encounter for immunization: Secondary | ICD-10-CM | POA: Diagnosis not present
